# Patient Record
Sex: Male | Born: 1975 | Race: Black or African American | Hispanic: No | Marital: Married | Smoking: Never smoker
Health system: Southern US, Community
[De-identification: ages and names within clinical notes are randomized; demographics above are authoritative.]

## PROBLEM LIST (undated history)

## (undated) DIAGNOSIS — I10 Essential (primary) hypertension: Secondary | ICD-10-CM

## (undated) HISTORY — DX: Essential (primary) hypertension: I10

## (undated) HISTORY — PX: WRIST SURGERY: SHX841

---

## 2012-05-16 ENCOUNTER — Encounter: Payer: Self-pay | Admitting: Family Medicine

## 2012-05-16 ENCOUNTER — Ambulatory Visit: Payer: 59

## 2012-05-16 ENCOUNTER — Ambulatory Visit (INDEPENDENT_AMBULATORY_CARE_PROVIDER_SITE_OTHER): Payer: 59 | Admitting: Family Medicine

## 2012-05-16 VITALS — BP 135/73 | HR 56 | Temp 98.0°F | Resp 16 | Ht 71.5 in | Wt 177.8 lb

## 2012-05-16 DIAGNOSIS — E785 Hyperlipidemia, unspecified: Secondary | ICD-10-CM

## 2012-05-16 DIAGNOSIS — I10 Essential (primary) hypertension: Secondary | ICD-10-CM

## 2012-05-16 DIAGNOSIS — M25539 Pain in unspecified wrist: Secondary | ICD-10-CM

## 2012-05-16 LAB — COMPREHENSIVE METABOLIC PANEL
Albumin: 4.8 g/dL (ref 3.5–5.2)
Alkaline Phosphatase: 73 U/L (ref 39–117)
Calcium: 9.7 mg/dL (ref 8.4–10.5)
Chloride: 102 mEq/L (ref 96–112)
Glucose, Bld: 84 mg/dL (ref 70–99)
Potassium: 4.2 mEq/L (ref 3.5–5.3)
Sodium: 137 mEq/L (ref 135–145)
Total Protein: 7.5 g/dL (ref 6.0–8.3)

## 2012-05-16 LAB — LIPID PANEL
LDL Cholesterol: 108 mg/dL — ABNORMAL HIGH (ref 0–99)
Triglycerides: 62 mg/dL (ref <150)

## 2012-05-16 MED ORDER — LISINOPRIL 20 MG PO TABS: 20.0000 mg | ORAL_TABLET | Freq: Every day | ORAL | Status: DC

## 2012-05-16 NOTE — Patient Instructions (Addendum)
If wrist pain not improving in 3-4 weeks - recheck. Can use over the counter alleve as needed and wrist brace temporarily.  Work on home exercise program. Your should receive a call or letter about your lab results within the next week to 10 days.

## 2012-05-16 NOTE — Progress Notes (Signed)
  Subjective:    Patient ID: Jerry Johnston, male    DOB: 27-Jan-1976, 36 y.o.   MRN: [id number]  HPI Jerry Johnston is a 36 y.o. male  F/u HTN - Low 104/58 - felt a little lightheaded, but was taking supplement then.  No sx's since stopping the supplements.  Still taking omega 3's, and MVI.  Eating well - no red meat in few months. LDL 126 - 10/03/10  R wrist sore - few weeks, notice it with bar curls in radial deviation.  tx : alleve and otc brace.  Notices pushing lawnmower.  Has cut back on weightlifting - helps some.  Martial arts class few years ago, wrist locks,.- rare pain since then, but more in past few weeks.  Did fall on outstretched hand during past few months at one point.  No swelling.  Review of Systems  Constitutional: Negative for fatigue and unexpected weight change.  Eyes: Negative for visual disturbance.  Respiratory: Negative for cough, chest tightness and shortness of breath.   Cardiovascular: Negative for chest pain, palpitations and leg swelling.  Gastrointestinal: Negative for abdominal pain and blood in stool.  Neurological: Positive for light-headedness. Negative for dizziness and headaches.       See hpi - single episode.  No new se's with meds.       Objective:   Physical Exam  Constitutional: He is oriented to person, place, and time. He appears well-developed and well-nourished.  HENT:  Head: Normocephalic and atraumatic.  Eyes: EOM are normal. Pupils are equal, round, and reactive to light.  Neck: No JVD present. Carotid bruit is not present.  Cardiovascular: Normal rate, regular rhythm and normal heart sounds.   No murmur heard. Pulmonary/Chest: Effort normal and breath sounds normal. He has no rales.  Abdominal: Soft. Bowel sounds are normal. He exhibits no pulsatile midline mass.  Musculoskeletal: He exhibits no edema.       Right wrist: He exhibits normal range of motion, no tenderness, no bony tenderness, no swelling and no effusion.   Minimal dorsal proximal wrist discomfort with resisted radial dev'n   Negative tinels and finkelsteins  Neurological: He is alert and oriented to person, place, and time.  Skin: Skin is warm and dry.  Psychiatric: He has a normal mood and affect. His behavior is normal.   [facility] reading (PRIMARY) by  Dr.Lynsay Fesperman: R wrist: no apparent fx.  No apparent scapholunate widening.      Assessment & Plan:  Jerry Johnston is a 36 y.o. male HTN - stable.  Improved libido (see prior ov's).  Check CMP and lipid panel - prior borderline LDL. Cont same meds.  R Wrist pain - past few months more noticeable.? Overuse vs prior strain - otc bracing ok.  No apparent scaphoid or lunate fx/necrosis on XR, but consider MRI if not improving in few weeks.   alleve prn, hep and recheck in 3-4 weeks if not improving.

## 2012-05-22 ENCOUNTER — Other Ambulatory Visit: Payer: Self-pay | Admitting: Family Medicine

## 2012-05-22 DIAGNOSIS — I10 Essential (primary) hypertension: Secondary | ICD-10-CM

## 2012-05-22 DIAGNOSIS — R7989 Other specified abnormal findings of blood chemistry: Secondary | ICD-10-CM

## 2012-06-05 ENCOUNTER — Other Ambulatory Visit (INDEPENDENT_AMBULATORY_CARE_PROVIDER_SITE_OTHER): Payer: 59 | Admitting: Family Medicine

## 2012-06-05 DIAGNOSIS — I10 Essential (primary) hypertension: Secondary | ICD-10-CM

## 2012-06-05 DIAGNOSIS — R7989 Other specified abnormal findings of blood chemistry: Secondary | ICD-10-CM

## 2012-06-05 DIAGNOSIS — R799 Abnormal finding of blood chemistry, unspecified: Secondary | ICD-10-CM

## 2012-06-05 LAB — BASIC METABOLIC PANEL
CO2: 28 mEq/L (ref 19–32)
Chloride: 102 mEq/L (ref 96–112)
Creat: 1.37 mg/dL — ABNORMAL HIGH (ref 0.50–1.35)
Glucose, Bld: 80 mg/dL (ref 70–99)
Sodium: 137 mEq/L (ref 135–145)

## 2012-11-21 ENCOUNTER — Ambulatory Visit: Payer: 59 | Admitting: Family Medicine

## 2012-11-24 ENCOUNTER — Encounter: Payer: Self-pay | Admitting: Family Medicine

## 2012-11-24 ENCOUNTER — Ambulatory Visit (INDEPENDENT_AMBULATORY_CARE_PROVIDER_SITE_OTHER): Payer: 59 | Admitting: Family Medicine

## 2012-11-24 VITALS — BP 134/69 | HR 68 | Temp 98.7°F | Resp 17 | Ht 73.0 in | Wt 180.0 lb

## 2012-11-24 DIAGNOSIS — I10 Essential (primary) hypertension: Secondary | ICD-10-CM

## 2012-11-24 DIAGNOSIS — M79609 Pain in unspecified limb: Secondary | ICD-10-CM

## 2012-11-24 DIAGNOSIS — M25531 Pain in right wrist: Secondary | ICD-10-CM

## 2012-11-24 MED ORDER — LISINOPRIL 20 MG PO TABS: 20.0000 mg | ORAL_TABLET | Freq: Every day | ORAL | Status: DC

## 2012-11-24 NOTE — Patient Instructions (Signed)
Your should receive a call or letter about your lab results within the next week to 10 days.  You should receive a call about thew hand therapy referral.  Recheck with me in 6 months.

## 2012-11-24 NOTE — Progress Notes (Signed)
Subjective:    Patient ID: Jerry Johnston, male    DOB: Jan 11, 1976, 36 y.o.   MRN: [id number]  HPI Jerry Johnston is a 36 y.o. male  HTN- no new side effects with meds.  Work had cholesterol testing - 09/26/12: TC: 200, HDL 64, trig 72, LDL 122. Glucose 75, BP 120/76. BMI 23.3  Taking lisinopril 20mg  qd.  Rare missed dose. Outside bp's: 120-129/61-69. Creat 1.53 to 1.37 form 5/13 to lab only visit in June.  Drinking more water - staying more hydrated now.  About 2 and 1/2 to 3 liters  Per day.    R wrist pain - R wrist sore - few weeks at 05/16/12 office visit. notice it with bar curls in radial deviation.  tx : alleve and otc brace.  Notices pushing lawnmower.  Has cut back on weightlifting - helped some.  Martial arts class few years ago, wrist locks,.- rare pain since then, but more in few weeks with falls noted at last office visit. (Did fall on outstretched hand during past few months at one point.  No swelling.)  XRay report on 5/31//13: Findings: There is no evidence of fracture or dislocation. There  is no evidence of arthropathy or other focal bone abnormality.  Soft tissues are unremarkable.  IMPRESSION:  Negative exam  Not as bad as few months ago - not daily, on.y notices with certain activities - picking up heavy objects or repetitive use only. Pain is on ulnar side of wrist.  No regular nsaid use - rare alleve. R hand dominant   Review of Systems  Constitutional: Negative for fatigue and unexpected weight change.  Eyes: Negative for visual disturbance.  Respiratory: Negative for cough, chest tightness and shortness of breath.   Cardiovascular: Negative for chest pain, palpitations and leg swelling.  Gastrointestinal: Negative for abdominal pain and blood in stool.  Musculoskeletal: Positive for arthralgias (R wrist as bove. ).  Neurological: Negative for dizziness, light-headedness and headaches.       Objective:   Physical Exam  Constitutional: He is oriented to  person, place, and time. He appears well-developed and well-nourished.  HENT:  Head: Normocephalic and atraumatic.  Eyes: EOM are normal. Pupils are equal, round, and reactive to light.  Neck: No JVD present. Carotid bruit is not present.  Cardiovascular: Normal rate, regular rhythm and normal heart sounds.   No murmur heard. Pulmonary/Chest: Effort normal and breath sounds normal. He has no rales.  Musculoskeletal: He exhibits no edema.       Right wrist: He exhibits tenderness (over ulnar sided wrist - area of tfcc. no bony ttp .pain with loading ulnar sided wrist. negative tinel, phalen, and finlkelstein's. ). He exhibits normal range of motion, no bony tenderness, no swelling, no effusion and no crepitus.  Neurological: He is alert and oriented to person, place, and time.  Skin: Skin is warm and dry.  Psychiatric: He has a normal mood and affect.       Assessment & Plan:  [name] is a 36 y.o. male 1. HTN (hypertension)   2. Right wrist pain    HTN - stable, borderline creat in past - recheck BMP, maintain adequate hydration. 6 months meds refilled.   R wrist pain - improved, episodic.  Possible TFCC injury in hindisght.  Discussed MRI, ortho eval, but as only mild sx's - can try PT initially - if no improvement - would check MRI or hand/orhto eval.  rtc precautions.   Patient Instructions  Your should receive a call or letter about your lab results within the next week to 10 days.  You should receive a call about thew hand therapy referral.  Recheck with me in 6 months.

## 2012-11-25 LAB — BASIC METABOLIC PANEL
BUN: 15 mg/dL (ref 6–23)
CO2: 30 mEq/L (ref 19–32)
Chloride: 103 mEq/L (ref 96–112)
Creat: 1.24 mg/dL (ref 0.50–1.35)

## 2013-01-14 ENCOUNTER — Telehealth: Payer: Self-pay

## 2013-01-14 DIAGNOSIS — M25531 Pain in right wrist: Secondary | ICD-10-CM

## 2013-01-14 NOTE — Telephone Encounter (Signed)
tfc tear and wrist completed 7 treatments w/o improvement Would like an mri - if okay with dr. Neva Seat Pt   DORA at  [facility]  [phone]

## 2013-01-14 NOTE — Telephone Encounter (Signed)
Noted.  Discussed with Verlee Monte - agree with arranging MR arthrogram of wrist to eval for TFCC tear, then if torn, can refer to Dr. Janee Morn at [facility] ortho.

## 2013-01-14 NOTE — Telephone Encounter (Signed)
Patient has been to PT for wrist/ no improvement would like to proceed with MRI scan, please advise.

## 2013-01-15 ENCOUNTER — Other Ambulatory Visit: Payer: Self-pay | Admitting: Radiology

## 2013-01-15 DIAGNOSIS — M25531 Pain in right wrist: Secondary | ICD-10-CM

## 2013-01-20 ENCOUNTER — Telehealth: Payer: Self-pay | Admitting: Radiology

## 2013-01-20 DIAGNOSIS — M25531 Pain in right wrist: Secondary | ICD-10-CM

## 2013-01-20 NOTE — Telephone Encounter (Signed)
They also needed order for needle placement under fluoro to go with the MR/ Arthrogram.

## 2013-01-20 NOTE — Telephone Encounter (Signed)
Phone call from [facility] they have advised to add on Arthrogram to r/o TFCC tear. Jerry Johnston

## 2013-01-21 ENCOUNTER — Other Ambulatory Visit: Payer: Self-pay

## 2013-01-27 ENCOUNTER — Ambulatory Visit
Admission: RE | Admit: 2013-01-27 | Discharge: 2013-01-27 | Disposition: A | Discharge status: Home / Self Care | Payer: PRIVATE HEALTH INSURANCE | Source: Ambulatory Visit | Attending: Family Medicine | Admitting: Family Medicine

## 2013-01-27 ENCOUNTER — Other Ambulatory Visit: Payer: Self-pay | Admitting: Family Medicine

## 2013-01-27 DIAGNOSIS — IMO0001 Reserved for inherently not codable concepts without codable children: Secondary | ICD-10-CM

## 2013-01-27 DIAGNOSIS — M25531 Pain in right wrist: Secondary | ICD-10-CM

## 2013-01-27 MED ORDER — IOHEXOL 180 MG/ML  SOLN
3.0000 mL | Freq: Once | INTRAMUSCULAR | Status: AC | PRN
  Administered 2013-01-27: 3 mL via INTRAVENOUS

## 2013-02-05 ENCOUNTER — Other Ambulatory Visit: Payer: Self-pay | Admitting: Family Medicine

## 2013-02-05 DIAGNOSIS — S638X1A Sprain of other part of right wrist and hand, initial encounter: Secondary | ICD-10-CM

## 2013-03-25 SURGERY — Surgical Case
Anesthesia: *Unknown

## 2013-03-31 ENCOUNTER — Ambulatory Visit (INDEPENDENT_AMBULATORY_CARE_PROVIDER_SITE_OTHER): Payer: Managed Care, Other (non HMO) | Admitting: Family Medicine

## 2013-03-31 VITALS — BP 127/79 | HR 61 | Temp 97.9°F | Resp 16 | Ht 72.0 in | Wt 180.0 lb

## 2013-03-31 DIAGNOSIS — Z888 Allergy status to other drugs, medicaments and biological substances status: Secondary | ICD-10-CM

## 2013-03-31 DIAGNOSIS — Z889 Allergy status to unspecified drugs, medicaments and biological substances status: Secondary | ICD-10-CM

## 2013-03-31 NOTE — Progress Notes (Signed)
Jerry Johnston is a 37 y.o. male who presents to [facility] today for rash. Patient is taking Percocet for pain control following a wrist surgery. He notes this morning he had a mildly itchy rash on his for head. He stopped taking the Percocet this morning. He denies any trouble breathing or swallowing mouth tingling. He feels well otherwise with no fevers or chill.  He has never had a drug reaction before.  His pain is well-controlled with 2 Aleve twice a day. He is scheduled to see Dr. Janee Morn on Tuesday.    PMH: Reviewed healthy otherwise History  Substance Use Topics  . Smoking status: Never Smoker   . Smokeless tobacco: Not on file  . Alcohol Use: Yes   ROS as above  Medications reviewed. Current Outpatient Prescriptions  Medication Sig Dispense Refill  . lisinopril (PRINIVIL,ZESTRIL) 20 MG tablet Take 1 tablet (20 mg total) by mouth daily.  90 tablet  1  . oxycodone-acetaminophen (PERCOCET) 2.5-325 MG per tablet Take 1 tablet by mouth every 4 (four) hours as needed for pain.      . fish oil-omega-3 fatty acids 1000 MG capsule Take 2 g by mouth daily.      . Multiple Vitamin (MULTIVITAMIN) tablet Take 1 tablet by mouth daily.       No current facility-administered medications for this visit.    Exam:  BP 127/79  Pulse 61  Temp(Src) 97.9 F (36.6 C) (Oral)  Resp 16  Ht 6' (1.829 m)  Wt 180 lb (81.647 kg)  BMI 24.41 kg/m2  SpO2 100% Gen: Well NAD HEENT: EOMI,  MMM Lungs: CTABL Nl WOB Heart: RRR no MRG Abd: NABS, NT, ND Exts: Non edematous BL  LE, warm and well perfused.  Skin: Mild erythematous rash on forhead  No results found for this or any previous visit (from the past 72 hour(s)).  Assessment and Plan: 37 y.o. male with possible drug allergy/reaction. Possibly unrelated dermatitis. His pain is well controlled off of Percocet recommend patient stop taking Percocet and follow up with Dr. Janee Morn as scheduled. Discussed warning signs or symptoms. Patient expresses  understanding and agreement.

## 2013-03-31 NOTE — Patient Instructions (Addendum)
Thank you for coming in today. I am not sure if you are allergic to percocet.  You can take benadryl if you are itching.  Stop percocet.  Take 2 aleve twice a day as needed for pain.  If you have trouble breathing or swallowing go to the ER.

## 2013-06-01 ENCOUNTER — Encounter: Payer: Self-pay | Admitting: Family Medicine

## 2013-06-01 ENCOUNTER — Ambulatory Visit (INDEPENDENT_AMBULATORY_CARE_PROVIDER_SITE_OTHER): Payer: Managed Care, Other (non HMO) | Admitting: Family Medicine

## 2013-06-01 VITALS — BP 120/74 | HR 63 | Temp 98.3°F | Resp 16 | Ht 71.5 in | Wt 180.2 lb

## 2013-06-01 DIAGNOSIS — I10 Essential (primary) hypertension: Secondary | ICD-10-CM

## 2013-06-01 LAB — BASIC METABOLIC PANEL
CO2: 24 mEq/L (ref 19–32)
Calcium: 9.6 mg/dL (ref 8.4–10.5)
Chloride: 104 mEq/L (ref 96–112)
Glucose, Bld: 93 mg/dL (ref 70–99)
Potassium: 4.2 mEq/L (ref 3.5–5.3)
Sodium: 139 mEq/L (ref 135–145)

## 2013-06-01 MED ORDER — LISINOPRIL 20 MG PO TABS: 20.0000 mg | ORAL_TABLET | Freq: Every day | ORAL | Status: DC

## 2013-06-01 NOTE — Progress Notes (Signed)
  Subjective:    Patient ID: Jerry Johnston, male    DOB: [birth year], 37 y.o.   MRN: [id number]  HPI Jerry Johnston is a 37 y.o. male  HTN - outside BP's - 114-132/58-75. Mostly in 110's/60-70 range.  Has been doing exercise 4-5 days per week. More cardio now than weightlifting. Not fasting. No cough, lightheadedness or dizziness. No new side effects.   Results for orders placed in visit on 11/24/12  BASIC METABOLIC PANEL      Result Value Range   Sodium 139  135 - 145 mEq/L   Potassium 4.7  3.5 - 5.3 mEq/L   Chloride 103  96 - 112 mEq/L   CO2 30  19 - 32 mEq/L   Glucose, Bld 79  70 - 99 mg/dL   BUN 15  6 - 23 mg/dL   Creat 4.09  8.11 - 9.14 mg/dL   Calcium 9.5  8.4 - 78.2 mg/dL    S/p surgery on R wrist 03/24/13 for ligamentous injury that did not improve with PT, apparently not TFCC.  2nd post op follow up with Dr. Janee Morn later today.    Review of Systems  Constitutional: Negative for fatigue and unexpected weight change.  Eyes: Negative for visual disturbance.  Respiratory: Negative for cough, chest tightness and shortness of breath.   Cardiovascular: Negative for chest pain, palpitations and leg swelling.  Gastrointestinal: Negative for abdominal pain and blood in stool.  Neurological: Negative for dizziness, light-headedness and headaches.       Objective:   Physical Exam  Vitals reviewed. Constitutional: He is oriented to person, place, and time. He appears well-developed and well-nourished.  HENT:  Head: Normocephalic and atraumatic.  Eyes: EOM are normal. Pupils are equal, round, and reactive to light.  Neck: No JVD present. Carotid bruit is not present.  Cardiovascular: Normal rate, regular rhythm and normal heart sounds.   No murmur heard. Pulmonary/Chest: Effort normal and breath sounds normal. He has no rales.  Musculoskeletal: He exhibits no edema.  Neurological: He is alert and oriented to person, place, and time.  Skin: Skin is warm and dry.   Psychiatric: He has a normal mood and affect.         Assessment & Plan:  Jerry Johnston is a 37 y.o. male HTN (hypertension) - Plan: lisinopril (PRINIVIL,ZESTRIL) 20 MG tablet, Basic metabolic panel  Controlled - can try decreased dose of lisinopril as below, but increase back to 20mg  if numbers elevate. Continue exercise. recheck in 6 months for CPE/fasting labs.   Meds ordered this encounter  Medications  . lisinopril (PRINIVIL,ZESTRIL) 20 MG tablet    Sig: Take 1 tablet (20 mg total) by mouth daily.    Dispense:  90 tablet    Refill:  1     Patient Instructions  Ok to try to split lisinopril in half (10mg ) each day if blood pressures remain in 110's over 60-70's.  If blood pressures run over 140/90 consistently - return to full pill each day.  Recheck in 6 months- should be for physical then. You should receive a call or letter about your lab results within the next week to 10 days.

## 2013-06-01 NOTE — Patient Instructions (Signed)
Ok to try to split lisinopril in half (10mg ) each day if blood pressures remain in 110's over 60-70's.  If blood pressures run over 140/90 consistently - return to full pill each day.  Recheck in 6 months- should be for physical then. You should receive a call or letter about your lab results within the next week to 10 days.

## 2013-10-22 ENCOUNTER — Other Ambulatory Visit: Payer: Self-pay

## 2013-11-30 ENCOUNTER — Encounter: Payer: Self-pay | Admitting: Family Medicine

## 2013-11-30 ENCOUNTER — Ambulatory Visit (INDEPENDENT_AMBULATORY_CARE_PROVIDER_SITE_OTHER): Payer: Managed Care, Other (non HMO) | Admitting: Family Medicine

## 2013-11-30 VITALS — BP 130/80 | HR 71 | Temp 98.0°F | Resp 16 | Ht 72.0 in | Wt 181.8 lb

## 2013-11-30 DIAGNOSIS — Z7251 High risk heterosexual behavior: Secondary | ICD-10-CM

## 2013-11-30 DIAGNOSIS — G4700 Insomnia, unspecified: Secondary | ICD-10-CM

## 2013-11-30 DIAGNOSIS — Z0000 Encounter for general adult medical examination without abnormal findings: Secondary | ICD-10-CM

## 2013-11-30 DIAGNOSIS — I10 Essential (primary) hypertension: Secondary | ICD-10-CM

## 2013-11-30 LAB — COMPREHENSIVE METABOLIC PANEL
Alkaline Phosphatase: 63 U/L (ref 39–117)
BUN: 11 mg/dL (ref 6–23)
CO2: 24 mEq/L (ref 19–32)
Creat: 1.07 mg/dL (ref 0.50–1.35)
Glucose, Bld: 95 mg/dL (ref 70–99)
Sodium: 135 mEq/L (ref 135–145)
Total Bilirubin: 0.7 mg/dL (ref 0.3–1.2)
Total Protein: 8.1 g/dL (ref 6.0–8.3)

## 2013-11-30 LAB — LIPID PANEL
HDL: 56 mg/dL (ref >39)
LDL Cholesterol: 128 mg/dL — ABNORMAL HIGH (ref 0–99)
Total CHOL/HDL Ratio: 3.5 Ratio
VLDL: 13 mg/dL (ref 0–40)

## 2013-11-30 LAB — CBC
HCT: 46.9 % (ref 39.0–52.0)
Hemoglobin: 16.5 g/dL (ref 13.0–17.0)
MCH: 29.9 pg (ref 26.0–34.0)
MCHC: 35.2 g/dL (ref 30.0–36.0)
MCV: 85 fL (ref 78.0–100.0)
RDW: 13.1 % (ref 11.5–15.5)

## 2013-11-30 MED ORDER — LISINOPRIL 20 MG PO TABS: 20.0000 mg | ORAL_TABLET | Freq: Every day | ORAL | Status: DC

## 2013-11-30 MED ORDER — ALPRAZOLAM 0.5 MG PO TABS: 0.5000 mg | ORAL_TABLET | Freq: Every evening | ORAL | Status: DC | PRN

## 2013-11-30 NOTE — Progress Notes (Signed)
   Subjective:    Patient ID: Jerry Johnston, male    DOB: [birth year], 37 y.o.   MRN: [id number]  HPI    Review of Systems  Constitutional: Negative.   HENT: Negative.   Eyes: Negative.   Respiratory: Negative.   Cardiovascular: Negative.   Gastrointestinal: Negative.   Endocrine: Negative.   Genitourinary: Negative.   Musculoskeletal: Negative.   Skin: Negative.   Allergic/Immunologic: Negative.   Neurological: Negative.   Hematological: Negative.   Psychiatric/Behavioral: Positive for sleep disturbance.       Objective:   Physical Exam        Assessment & Plan:

## 2013-11-30 NOTE — Patient Instructions (Addendum)
Can continue same dose of lisinopril, or if you want to try a few weeks on 1/2 dose, keep a record of your blood pressures. You should receive a call or letter about your lab results within the next week to 10 days. May need true 8 hour fasting levels, but can look at results first.  Work on sleep hygiene as discussed.  Xanax at bedtime if needed only. Let me know if this persists.  Plan on recheck in 6 months.  Return to the clinic or go to the nearest emergency room if any of your symptoms worsen or new symptoms occur.   Insomnia Insomnia is frequent trouble falling and/or staying asleep. Insomnia can be a long term problem or a short term problem. Both are common. Insomnia can be a short term problem when the wakefulness is related to a certain stress or worry. Long term insomnia is often related to ongoing stress during waking hours and/or poor sleeping habits. Overtime, sleep deprivation itself can make the problem worse. Every little thing feels more severe because you are overtired and your ability to cope is decreased. CAUSES   Stress, anxiety, and depression.  Poor sleeping habits.  Distractions such as TV in the bedroom.  Naps close to bedtime.  Engaging in emotionally charged conversations before bed.  Technical reading before sleep.  Alcohol and other sedatives. They may make the problem worse. They can hurt normal sleep patterns and normal dream activity.  Stimulants such as caffeine for several hours prior to bedtime.  Pain syndromes and shortness of breath can cause insomnia.  Exercise late at night.  Changing time zones may cause sleeping problems (jet lag). It is sometimes helpful to have someone observe your sleeping patterns. They should look for periods of not breathing during the night (sleep apnea). They should also look to see how long those periods last. If you live alone or observers are uncertain, you can also be observed at a sleep clinic where your sleep  patterns will be professionally monitored. Sleep apnea requires a checkup and treatment. Give your caregivers your medical history. Give your caregivers observations your family has made about your sleep.  SYMPTOMS   Not feeling rested in the morning.  Anxiety and restlessness at bedtime.  Difficulty falling and staying asleep. TREATMENT   Your caregiver may prescribe treatment for an underlying medical disorders. Your caregiver can give advice or help if you are using alcohol or other drugs for self-medication. Treatment of underlying problems will usually eliminate insomnia problems.  Medications can be prescribed for short time use. They are generally not recommended for lengthy use.  Over-the-counter sleep medicines are not recommended for lengthy use. They can be habit forming.  You can promote easier sleeping by making lifestyle changes such as:  Using relaxation techniques that help with breathing and reduce muscle tension.  Exercising earlier in the day.  Changing your diet and the time of your last meal. No night time snacks.  Establish a regular time to go to bed.  Counseling can help with stressful problems and worry.  Soothing music and white noise may be helpful if there are background noises you cannot remove.  Stop tedious detailed work at least one hour before bedtime. HOME CARE INSTRUCTIONS   Keep a diary. Inform your caregiver about your progress. This includes any medication side effects. See your caregiver regularly. Take note of:  Times when you are asleep.  Times when you are awake during the night.  The quality of  your sleep.  How you feel the next day. This information will help your caregiver care for you.  Get out of bed if you are still awake after 15 minutes. Read or do some quiet activity. Keep the lights down. Wait until you feel sleepy and go back to bed.  Keep regular sleeping and waking hours. Avoid naps.  Exercise regularly.  Avoid  distractions at bedtime. Distractions include watching television or engaging in any intense or detailed activity like attempting to balance the household checkbook.  Develop a bedtime ritual. Keep a familiar routine of bathing, brushing your teeth, climbing into bed at the same time each night, listening to soothing music. Routines increase the success of falling to sleep faster.  Use relaxation techniques. This can be using breathing and muscle tension release routines. It can also include visualizing peaceful scenes. You can also help control troubling or intruding thoughts by keeping your mind occupied with boring or repetitive thoughts like the old concept of counting sheep. You can make it more creative like imagining planting one beautiful flower after another in your backyard garden.  During your day, work to eliminate stress. When this is not possible use some of the previous suggestions to help reduce the anxiety that accompanies stressful situations. MAKE SURE YOU:   Understand these instructions.  Will watch your condition.  Will get help right away if you are not doing well or get worse. Document Released: 11/30/2000 Document Revised: 02/25/2012 Document Reviewed: 12/31/2007 [facility] Patient Information 2014 [place], [place].    Keeping you healthy  Get these tests  Blood pressure- Have your blood pressure checked once a year by your healthcare provider.  Normal blood pressure is 120/80.  Weight- Have your body mass index (BMI) calculated to screen for obesity.  BMI is a measure of body fat based on height and weight. You can also calculate your own BMI at https://www.west-esparza.com/.  Cholesterol- Have your cholesterol checked regularly starting at age 87, sooner may be necessary if you have diabetes, high blood pressure, if a family member developed heart diseases at an early age or if you smoke.   Chlamydia, HIV, and other sexual transmitted disease- Get screened each year  until the age of 96 then within three months of each new sexual partner.  Diabetes- Have your blood sugar checked regularly if you have high blood pressure, high cholesterol, a family history of diabetes or if you are overweight.  Get these vaccines  Flu shot- Every fall.  Tetanus shot- Every 10 years.  Menactra- Single dose; prevents meningitis.  Take these steps  Don't smoke- If you do smoke, ask your healthcare provider about quitting. For tips on how to quit, go to www.smokefree.gov or call 1-800-QUIT-NOW.  Be physically active- Exercise 5 days a week for at least 30 minutes.  If you are not already physically active start slow and gradually work up to 30 minutes of moderate physical activity.  Examples of moderate activity include walking briskly, mowing the yard, dancing, swimming bicycling, etc.  Eat a healthy diet- Eat a variety of healthy foods such as fruits, vegetables, low fat milk, low fat cheese, yogurt, lean meats, poultry, fish, beans, tofu, etc.  For more information on healthy eating, go to www.thenutritionsource.org  Drink alcohol in moderation- Limit alcohol intake two drinks or less a day.  Never drink and drive.  Dentist- Brush and floss teeth twice daily; visit your dentis twice a year.  Depression-Your emotional health is as important as your physical health.  If you're feeling down, losing interest in things you normally enjoy please talk with your healthcare provider.  Gun Safety- If you keep a gun in your home, keep it unloaded and with the safety lock on.  Bullets should be stored separately.  Helmet use- Always wear a helmet when riding a motorcycle, bicycle, rollerblading or skateboarding.  Safe sex- If you may be exposed to a sexually transmitted infection, use a condom  Seat belts- Seat bels can save your life; always wear one.  Smoke/Carbon Monoxide detectors- These detectors need to be installed on the appropriate level of your home.  Replace  batteries at least once a year.  Skin Cancer- When out in the sun, cover up and use sunscreen SPF 15 or higher.  Violence- If anyone is threatening or hurting you, please tell your healthcare provider.

## 2013-11-30 NOTE — Progress Notes (Signed)
Subjective   :    Patient ID: Jerry Johnston, male    DOB: September 18, 1976, 37 y.o.   MRN: [id number]  HPI Jerry Johnston is a 37 y.o. male  Tetanus/Tdap: 5/08 Flu vaccine : 09/30/13 Dentist: plans on scheduling Optho/eye care eval: no recent eval, no eye doctor. Sexual activity: 2 partners in past year. No hx of STI. No new rash, no penile d/c, asx. No unprotected intercourse.   S/p Hep B vaccination x3, Hep A x2, in 2008. Tdap in 5/08.    Recent breakup with GF. Started dating after Labor Day. Found out her intentions last night - not for serious relationship. Trouble sleeping some past 3 weeks with seeing things not going well. Sexually active prior, not in past 3 weeks. Mind racing, then wakes up thinking again. No depression, no SI.  Etoh: few drinks per week, some none.   HTN - discussed decreased dose of lisinopril at last ov in June to 1/2 of 20mg  (10mg  dose) QD, but stayed at same dose Weight 180 last ov. 181 today.Creat 1.24 on 06/01/13. Last lipids 05/16/12 - LDL 108, but HDL 50, tchol 170.  - see below.  Home blood pressures: 116/58 - 132/72. Health screening 09/30/13 - tchol: 215, LDL 142, trig 63, HDL 60. Has been trying to watch diet, exercising 4- 5 times per week - improved since oct. Last ate 5 hours ago.     Chemistry      Component Value Date/Time   NA 139 06/01/2013 1503   K 4.2 06/01/2013 1503   CL 104 06/01/2013 1503   CO2 24 06/01/2013 1503   BUN 14 06/01/2013 1503   CREATININE 1.24 06/01/2013 1503      Component Value Date/Time   CALCIUM 9.6 06/01/2013 1503   ALKPHOS 73 05/16/2012 1356   AST 22 05/16/2012 1356   ALT 16 05/16/2012 1356   BILITOT 0.9 05/16/2012 1356     Lab Results  Component Value Date   CHOL 170 05/16/2012   HDL 50 05/16/2012   LDLCALC 108* 05/16/2012   TRIG 62 05/16/2012   CHOLHDL 3.4 05/16/2012     Patient Active Problem List   Diagnosis Date Noted  . Drug allergy 03/31/2013   Past Medical History  Diagnosis Date  . Hypertension     Past Surgical History  Procedure Laterality Date  . Wrist surgery     Allergies  Allergen Reactions  . Percocet [Oxycodone-Acetaminophen]     ? Reaction pt stated he had broken out during the time of taking med.   Prior to Admission medications   Medication Sig Start Date End Date Taking? Authorizing Provider  fish oil-omega-3 fatty acids 1000 MG capsule Take 2 g by mouth daily.    Historical Provider, MD  lisinopril (PRINIVIL,ZESTRIL) 20 MG tablet Take 1 tablet (20 mg total) by mouth daily. 06/01/13   [name], MD  Multiple Vitamin (MULTIVITAMIN) tablet Take 1 tablet by mouth daily.    Historical Provider, MD  oxycodone-acetaminophen (PERCOCET) 2.5-325 MG per tablet Take 1 tablet by mouth every 4 (four) hours as needed for pain.    Historical Provider, MD   History   Social History  . Marital Status: Single    Spouse Name: N/A    Number of Children: N/A  . Years of Education: N/A   Occupational History  . Not on file.   Social History Main Topics  . Smoking status: Never Smoker   . Smokeless tobacco: Not on  file  . Alcohol Use: Yes  . Drug Use: No  . Sexual Activity: Yes    Birth Control/ Protection: Condom   Other Topics Concern  . Not on file   Social History Narrative  . No narrative on file    Review of Systems 13 point review of systems per patient health survey noted.  Negative other than as indicated on reviewed nursing note.      Objective:   Physical Exam  Vitals reviewed. Constitutional: He is oriented to person, place, and time. He appears well-developed and well-nourished.  HENT:  Head: Normocephalic and atraumatic.  Right Ear: External ear normal.  Left Ear: External ear normal.  Mouth/Throat: Oropharynx is clear and moist.  Eyes: Conjunctivae and EOM are normal. Pupils are equal, round, and reactive to light.  Neck: Normal range of motion. Neck supple. No JVD present. Carotid bruit is not present. No thyromegaly present.   Cardiovascular: Normal rate, regular rhythm, normal heart sounds and intact distal pulses.   No murmur heard. Pulmonary/Chest: Effort normal and breath sounds normal. No respiratory distress. He has no wheezes. He has no rales.  Abdominal: Soft. He exhibits no distension. There is no tenderness. Hernia confirmed negative in the right inguinal area and confirmed negative in the left inguinal area.  Genitourinary:  Genital exam deferred.   Musculoskeletal: Normal range of motion. He exhibits no edema and no tenderness.  Lymphadenopathy:    He has no cervical adenopathy.  Neurological: He is alert and oriented to person, place, and time. He has normal reflexes.  Skin: Skin is warm and dry.  Psychiatric: He has a normal mood and affect. His behavior is normal.      Assessment & Plan:   [name] is a 37 y.o. male Routine general medical examination at a health care facility - Plan: CBC, TSH, Comprehensive metabolic panel, Lipid panel, Hepatitis B surface antigen, Hepatitis B surface antibody, Hepatitis C antibody, HIV antibody, RPR, HSV(herpes simplex vrs) 1+2 ab-IgG, GC/Chlamydia Probe Amp.  Anticipatory guidance below, labs above.   HTN (hypertension) - Plan: CBC, TSH, Comprehensive metabolic panel, lisinopril (PRINIVIL,ZESTRIL) 20 MG tablet.  Stable on outside readings. Cont same dose lisinopril for now, but if trending lower BP's can self decrease for trial of 1/2 lisinopril 20mg  qd. Recheck in 6 months.   Insomnia - Plan: TSH, ALPRAZolam (XANAX) 0.5 MG tablet.  Sleep hygiene discussed.situational stress, counseled on this.  If needed short term, can try xanax qhs prn. Sed.   Problems related to high-risk sexual behavior - Plan: HIV antibody, RPR, HSV(herpes simplex vrs) 1+2 ab-IgG, GC/Chlamydia Probe Amp.  Asymptomatic, but 2 new partners in past year. Safer sex practices discussed. rtc precautions.    Meds ordered this encounter  Medications  . lisinopril (PRINIVIL,ZESTRIL) 20  MG tablet    Sig: Take 1 tablet (20 mg total) by mouth daily.    Dispense:  90 tablet    Refill:  1  . ALPRAZolam (XANAX) 0.5 MG tablet    Sig: Take 1 tablet (0.5 mg total) by mouth at bedtime as needed for sleep.    Dispense:  20 tablet    Refill:  0   Patient Instructions  Can continue same dose of lisinopril, or if you want to try a few weeks on 1/2 dose, keep a record of your blood pressures. You should receive a call or letter about your lab results within the next week to 10 days. May need true 8 hour fasting levels, but  can look at results first.  Work on sleep hygiene as discussed.  Xanax at bedtime if needed only. Let me know if this persists.  Plan on recheck in 6 months.  Return to the clinic or go to the nearest emergency room if any of your symptoms worsen or new symptoms occur.   Insomnia Insomnia is frequent trouble falling and/or staying asleep. Insomnia can be a long term problem or a short term problem. Both are common. Insomnia can be a short term problem when the wakefulness is related to a certain stress or worry. Long term insomnia is often related to ongoing stress during waking hours and/or poor sleeping habits. Overtime, sleep deprivation itself can make the problem worse. Every little thing feels more severe because you are overtired and your ability to cope is decreased. CAUSES   Stress, anxiety, and depression.  Poor sleeping habits.  Distractions such as TV in the bedroom.  Naps close to bedtime.  Engaging in emotionally charged conversations before bed.  Technical reading before sleep.  Alcohol and other sedatives. They may make the problem worse. They can hurt normal sleep patterns and normal dream activity.  Stimulants such as caffeine for several hours prior to bedtime.  Pain syndromes and shortness of breath can cause insomnia.  Exercise late at night.  Changing time zones may cause sleeping problems (jet lag). It is sometimes helpful to  have someone observe your sleeping patterns. They should look for periods of not breathing during the night (sleep apnea). They should also look to see how long those periods last. If you live alone or observers are uncertain, you can also be observed at a sleep clinic where your sleep patterns will be professionally monitored. Sleep apnea requires a checkup and treatment. Give your caregivers your medical history. Give your caregivers observations your family has made about your sleep.  SYMPTOMS   Not feeling rested in the morning.  Anxiety and restlessness at bedtime.  Difficulty falling and staying asleep. TREATMENT   Your caregiver may prescribe treatment for an underlying medical disorders. Your caregiver can give advice or help if you are using alcohol or other drugs for self-medication. Treatment of underlying problems will usually eliminate insomnia problems.  Medications can be prescribed for short time use. They are generally not recommended for lengthy use.  Over-the-counter sleep medicines are not recommended for lengthy use. They can be habit forming.  You can promote easier sleeping by making lifestyle changes such as:  Using relaxation techniques that help with breathing and reduce muscle tension.  Exercising earlier in the day.  Changing your diet and the time of your last meal. No night time snacks.  Establish a regular time to go to bed.  Counseling can help with stressful problems and worry.  Soothing music and white noise may be helpful if there are background noises you cannot remove.  Stop tedious detailed work at least one hour before bedtime. HOME CARE INSTRUCTIONS   Keep a diary. Inform your caregiver about your progress. This includes any medication side effects. See your caregiver regularly. Take note of:  Times when you are asleep.  Times when you are awake during the night.  The quality of your sleep.  How you feel the next day. This information  will help your caregiver care for you.  Get out of bed if you are still awake after 15 minutes. Read or do some quiet activity. Keep the lights down. Wait until you feel sleepy and go back to  bed.  Keep regular sleeping and waking hours. Avoid naps.  Exercise regularly.  Avoid distractions at bedtime. Distractions include watching television or engaging in any intense or detailed activity like attempting to balance the household checkbook.  Develop a bedtime ritual. Keep a familiar routine of bathing, brushing your teeth, climbing into bed at the same time each night, listening to soothing music. Routines increase the success of falling to sleep faster.  Use relaxation techniques. This can be using breathing and muscle tension release routines. It can also include visualizing peaceful scenes. You can also help control troubling or intruding thoughts by keeping your mind occupied with boring or repetitive thoughts like the old concept of counting sheep. You can make it more creative like imagining planting one beautiful flower after another in your backyard garden.  During your day, work to eliminate stress. When this is not possible use some of the previous suggestions to help reduce the anxiety that accompanies stressful situations. MAKE SURE YOU:   Understand these instructions.  Will watch your condition.  Will get help right away if you are not doing well or get worse. Document Released: 11/30/2000 Document Revised: 02/25/2012 Document Reviewed: 12/31/2007 [facility] Patient Information 2014 [place], [place].    Keeping you healthy  Get these tests  Blood pressure- Have your blood pressure checked once a year by your healthcare provider.  Normal blood pressure is 120/80.  Weight- Have your body mass index (BMI) calculated to screen for obesity.  BMI is a measure of body fat based on height and weight. You can also calculate your own BMI at https://www.west-esparza.com/.  Cholesterol-  Have your cholesterol checked regularly starting at age 63, sooner may be necessary if you have diabetes, high blood pressure, if a family member developed heart diseases at an early age or if you smoke.   Chlamydia, HIV, and other sexual transmitted disease- Get screened each year until the age of 79 then within three months of each new sexual partner.  Diabetes- Have your blood sugar checked regularly if you have high blood pressure, high cholesterol, a family history of diabetes or if you are overweight.  Get these vaccines  Flu shot- Every fall.  Tetanus shot- Every 10 years.  Menactra- Single dose; prevents meningitis.  Take these steps  Don't smoke- If you do smoke, ask your healthcare provider about quitting. For tips on how to quit, go to www.smokefree.gov or call 1-800-QUIT-NOW.  Be physically active- Exercise 5 days a week for at least 30 minutes.  If you are not already physically active start slow and gradually work up to 30 minutes of moderate physical activity.  Examples of moderate activity include walking briskly, mowing the yard, dancing, swimming bicycling, etc.  Eat a healthy diet- Eat a variety of healthy foods such as fruits, vegetables, low fat milk, low fat cheese, yogurt, lean meats, poultry, fish, beans, tofu, etc.  For more information on healthy eating, go to www.thenutritionsource.org  Drink alcohol in moderation- Limit alcohol intake two drinks or less a day.  Never drink and drive.  Dentist- Brush and floss teeth twice daily; visit your dentis twice a year.  Depression-Your emotional health is as important as your physical health.  If you're feeling down, losing interest in things you normally enjoy please talk with your healthcare provider.  Gun Safety- If you keep a gun in your home, keep it unloaded and with the safety lock on.  Bullets should be stored separately.  Helmet use- Always wear a helmet  when riding a motorcycle, bicycle, rollerblading or  skateboarding.  Safe sex- If you may be exposed to a sexually transmitted infection, use a condom  Seat belts- Seat bels can save your life; always wear one.  Smoke/Carbon Monoxide detectors- These detectors need to be installed on the appropriate level of your home.  Replace batteries at least once a year.  Skin Cancer- When out in the sun, cover up and use sunscreen SPF 15 or higher.  Violence- If anyone is threatening or hurting you, please tell your healthcare provider.

## 2013-12-01 LAB — RPR

## 2013-12-01 LAB — HEPATITIS B SURFACE ANTIBODY, QUANTITATIVE: Hepatitis B-Post: 4.6 m[IU]/mL

## 2013-12-01 LAB — GC/CHLAMYDIA PROBE AMP
CT Probe RNA: NEGATIVE
GC Probe RNA: NEGATIVE

## 2013-12-01 LAB — HEPATITIS B SURFACE ANTIGEN: Hepatitis B Surface Ag: NEGATIVE

## 2013-12-01 LAB — HEPATITIS C ANTIBODY: HCV Ab: NEGATIVE

## 2014-02-26 IMAGING — CR DG ORBITS FOR FOREIGN BODY
2 series · 2 of 2 positions shown · non-contrast
Comparison: None.

CLINICAL DATA: Metal working/exposure; clearance prior to MRI

ORBITS FOR FOREIGN BODY - 2 VIEW

[view not recorded (1 of 2)]
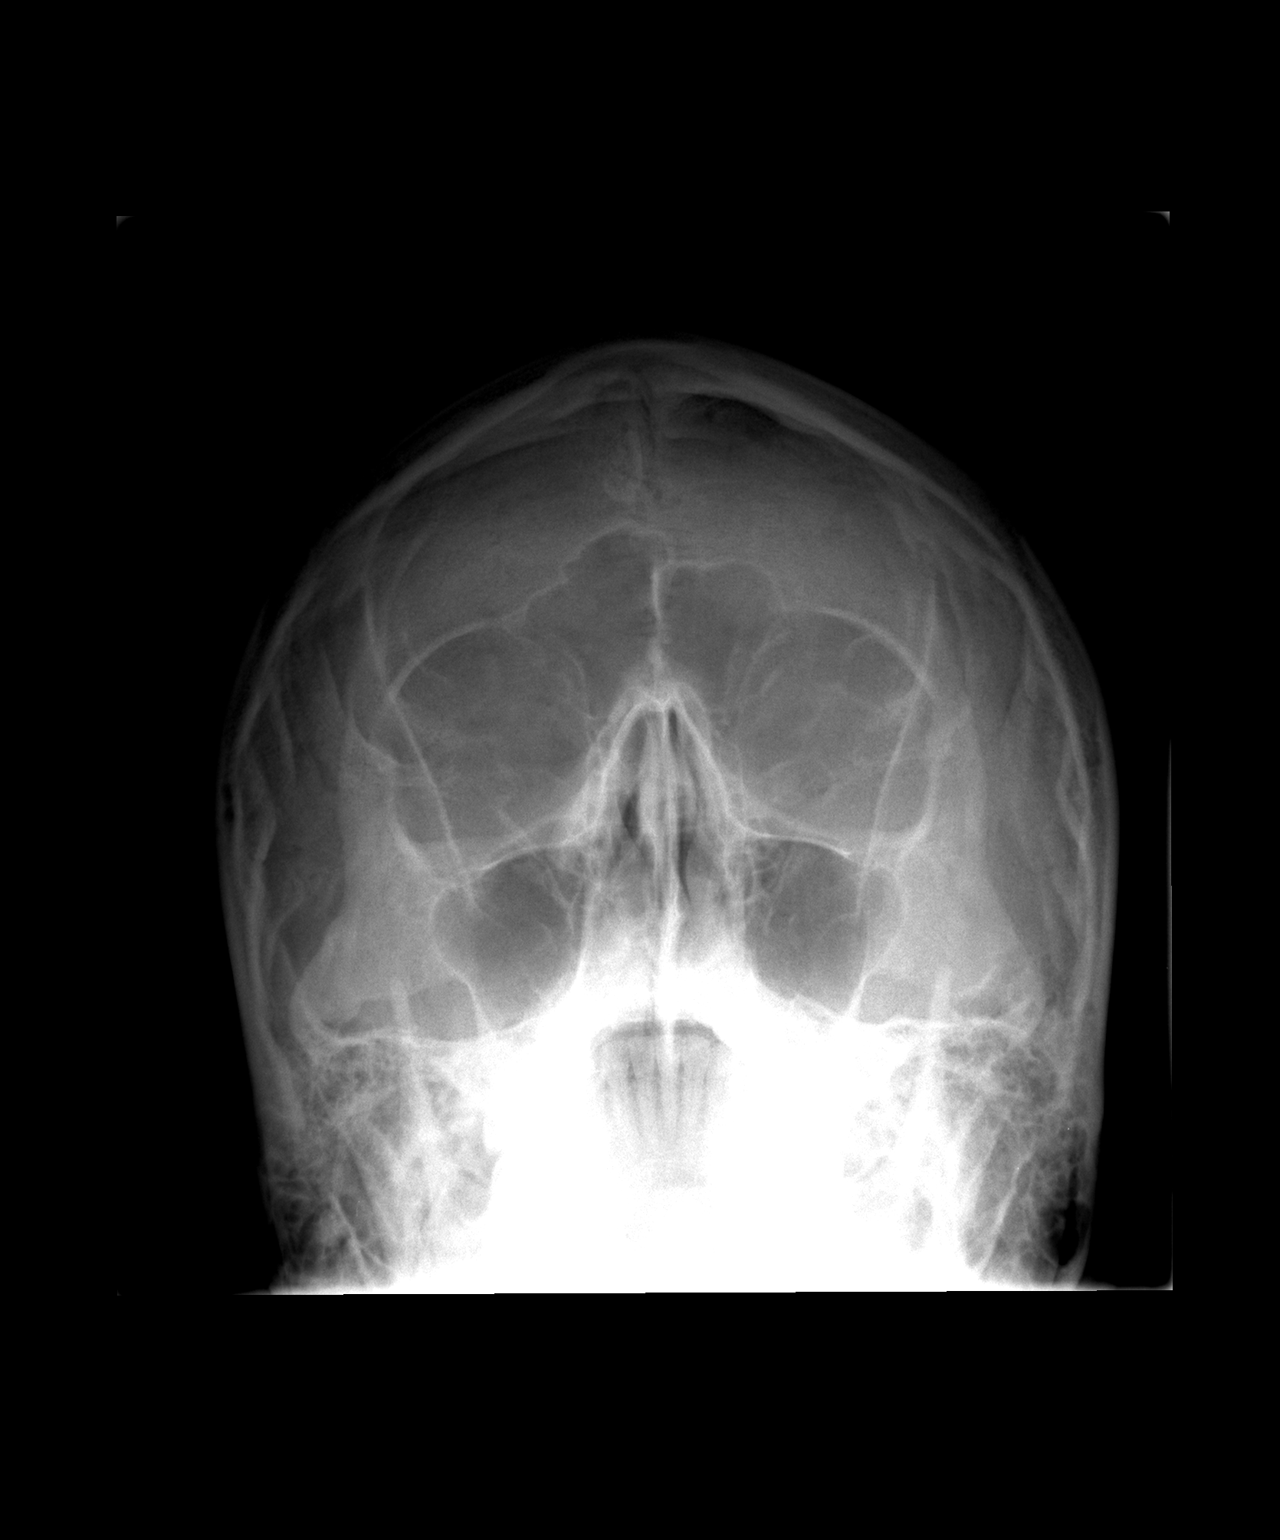

[view not recorded (2 of 2)]
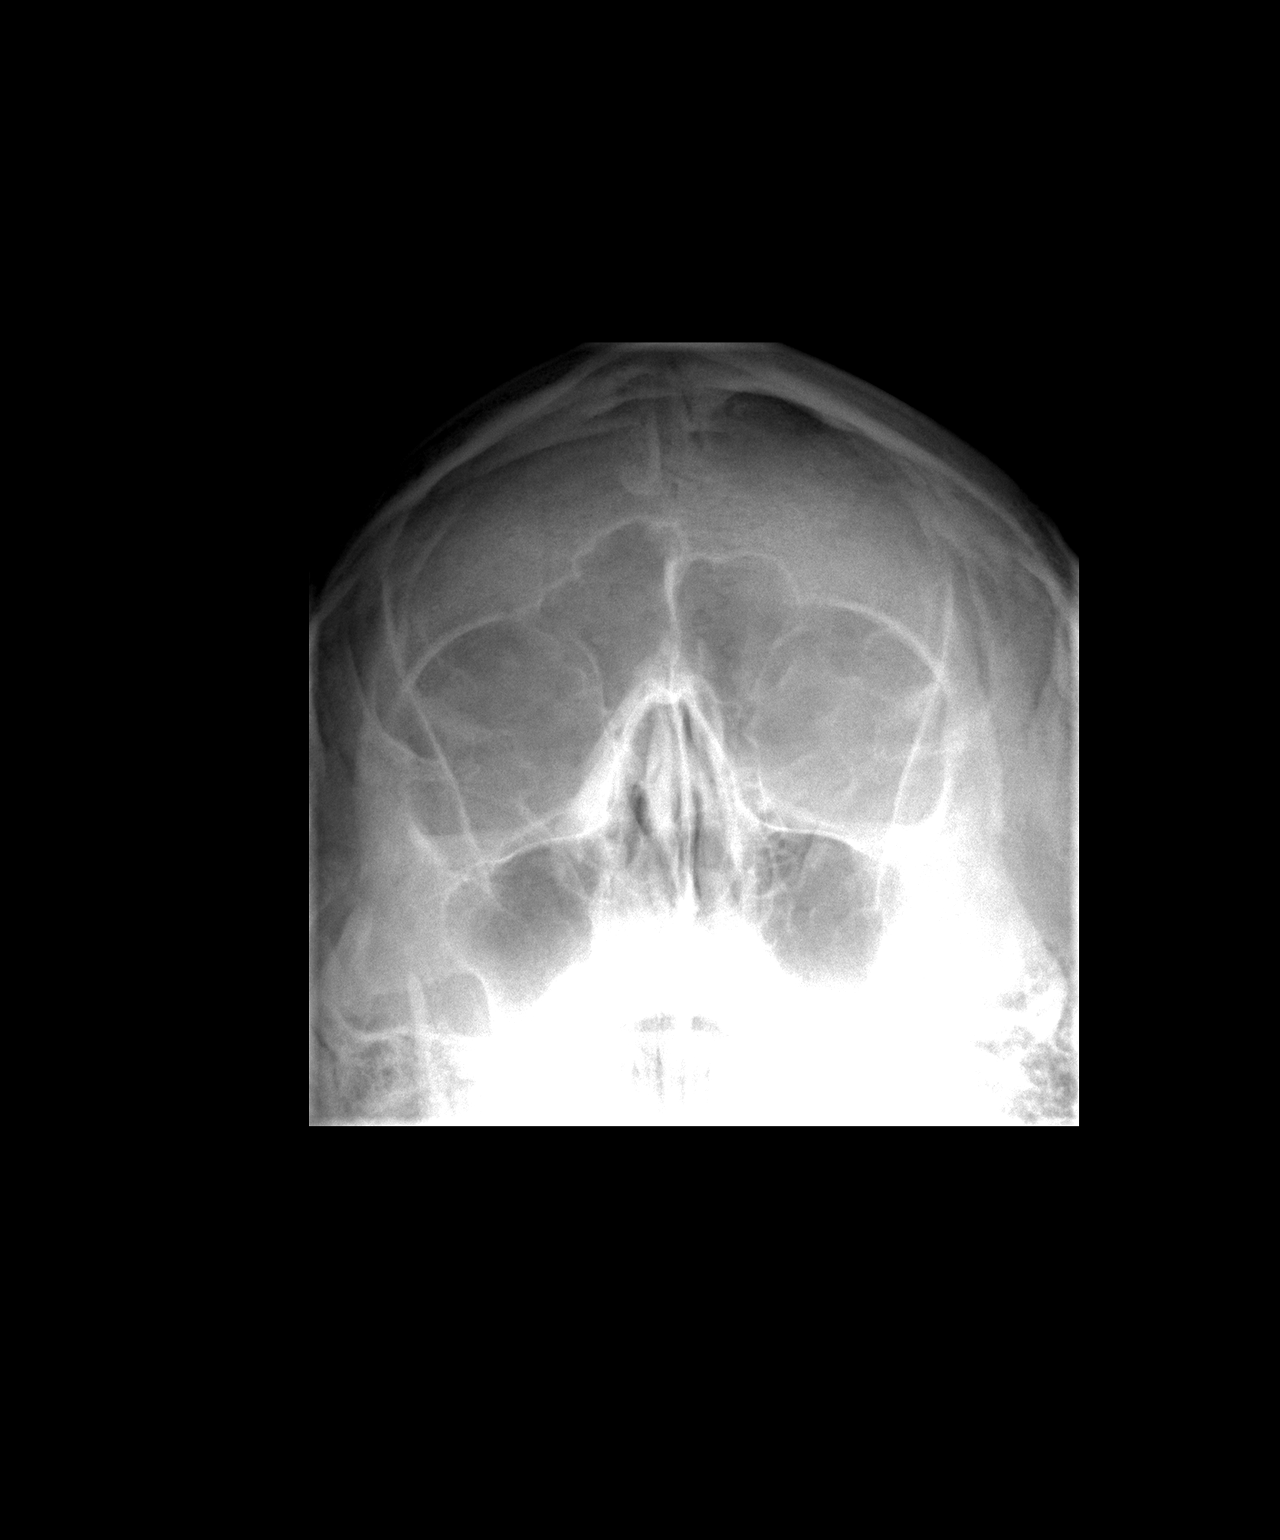

[2 of 2 positions shown; findings below may reference images not displayed]

FINDINGS: There is no evidence of metallic foreign body within the
orbits.  No significant bone abnormality identified.
IMPRESSION: No evidence of metallic foreign body within the orbits.

## 2014-02-26 IMAGING — RF DG FLUORO GUIDE NDL PLC/BX
4 series · 4 of 4 positions shown · IV contrast (multihance)
Comparison: none

CLINICAL DATA: Fall.  Right wrist pain.

Fluoroscopy Time: 40 seconds
RIGHT WRIST INJECTION UNDER FLUOROSCOPY
TECHNIQUE: An appropriate skin entrance site was determined. The
site was marked, prepped with Betadine, draped in the usual sterile
fashion, and infiltrated locally with 1% Lidocaine.  A 25 gauge
skin needle was advanced into the radiocarpal joint under
intermittent fluoroscopy.  A mixture of 0.05 ml Multihance and 10
ml of dilute Omnipaque 180 was then used to opacify the proximal
carpal joint.  No immediate complication.

[Series 1: (hospital) · 1 of 1 slices shown (1 of 4)]
[im 1/1]
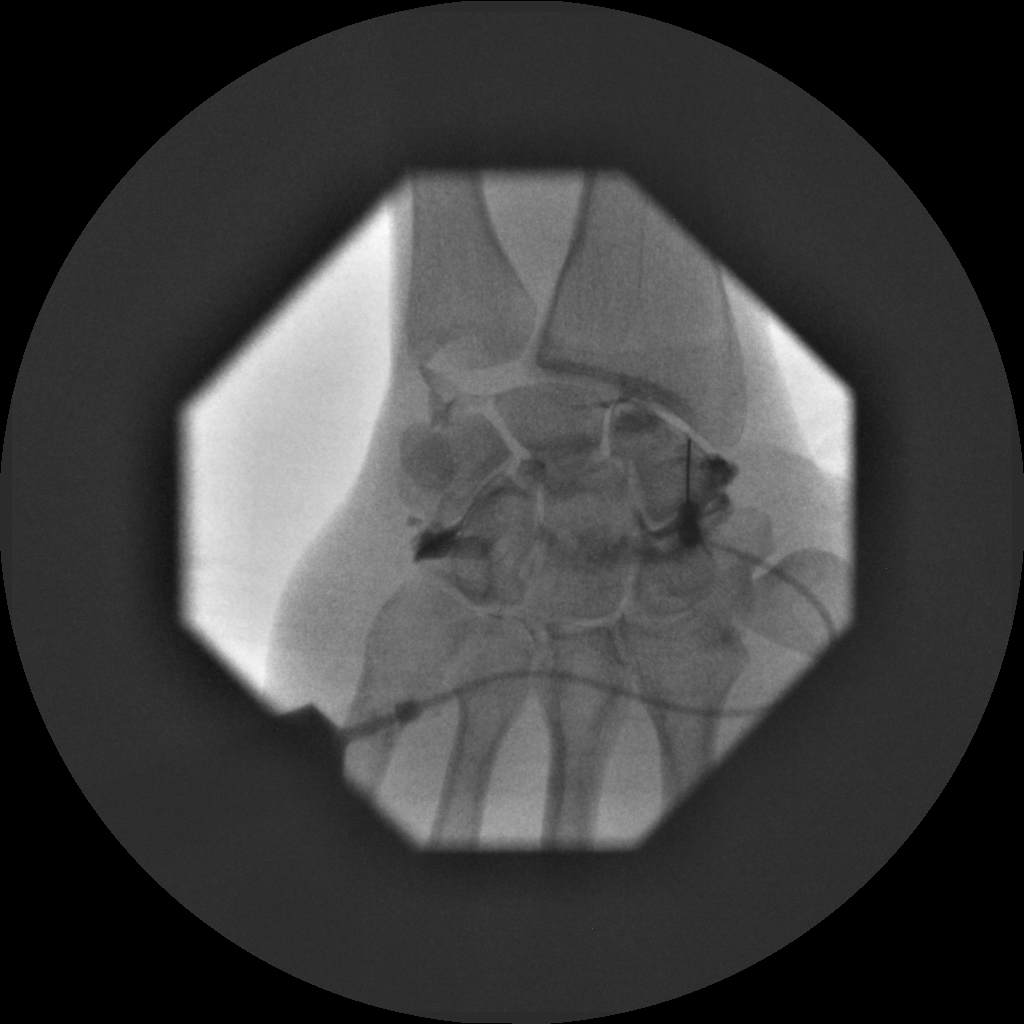

[Series 2: (hospital) · 1 of 1 slices shown (2 of 4)]
[im 1/1]
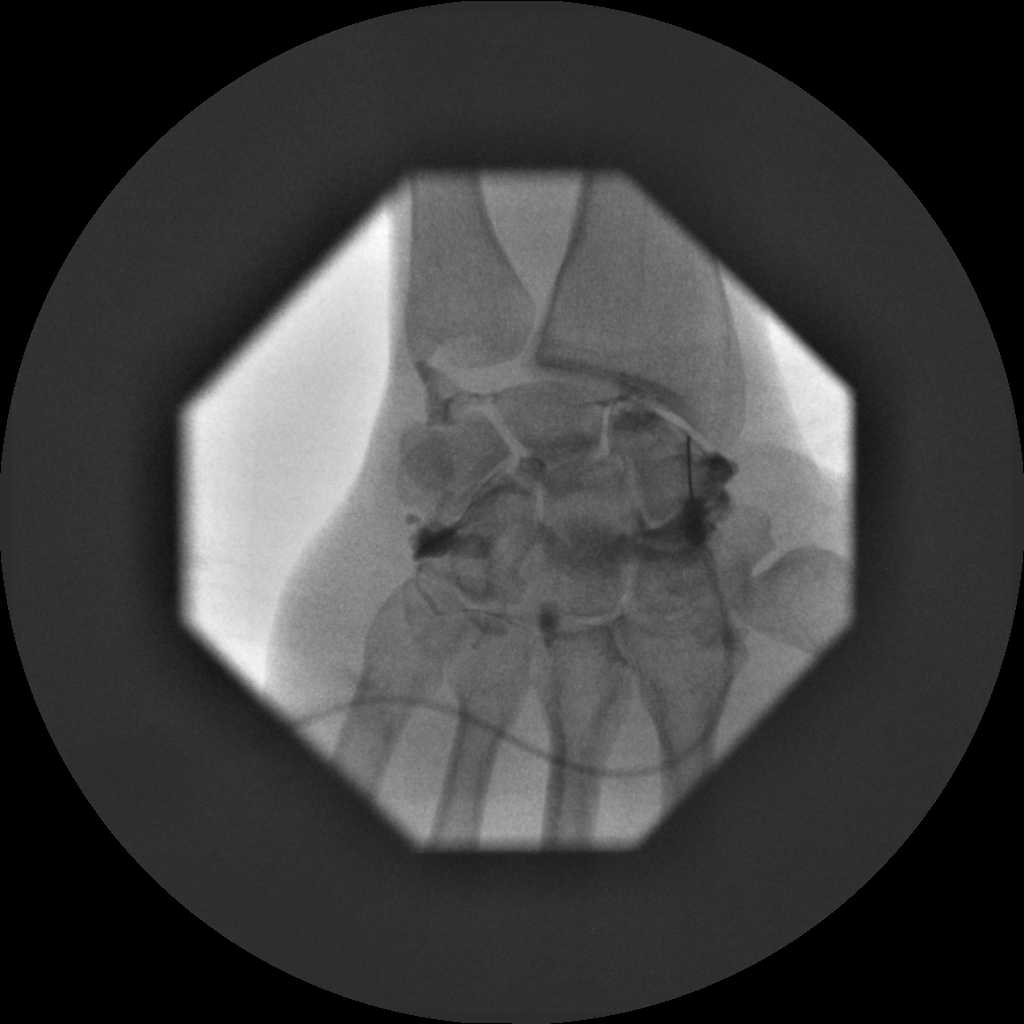

[Series 3: (hospital) · 1 of 1 slices shown (3 of 4)]
[im 1/1]
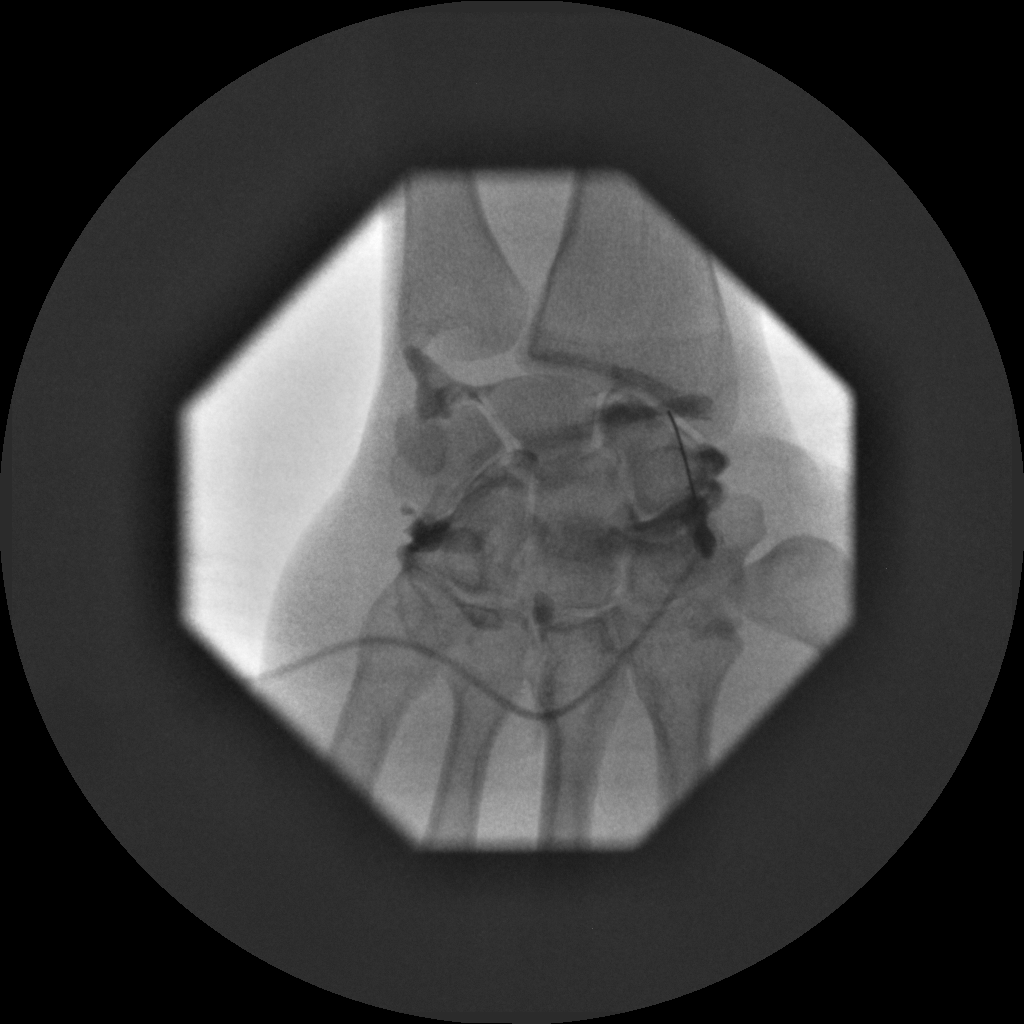

[Series 4: (hospital) · 1 of 1 slices shown (4 of 4)]
[im 1/1]
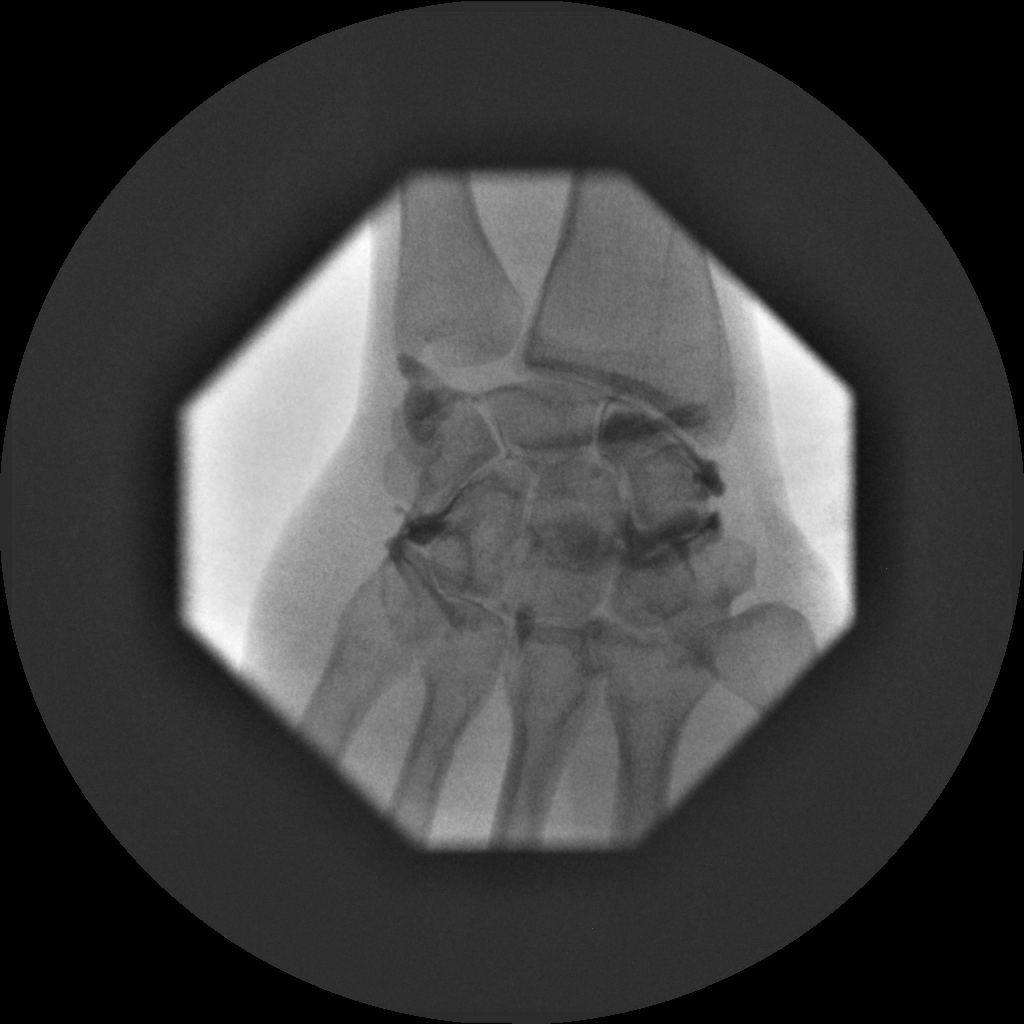

[4 of 4 positions shown; findings below may reference images not displayed]

IMPRESSION: Technically successful right wrist injection for MRI.

## 2014-05-31 ENCOUNTER — Encounter: Payer: Self-pay | Admitting: Family Medicine

## 2014-05-31 ENCOUNTER — Ambulatory Visit (INDEPENDENT_AMBULATORY_CARE_PROVIDER_SITE_OTHER): Payer: Managed Care, Other (non HMO) | Admitting: Family Medicine

## 2014-05-31 VITALS — BP 116/70 | HR 61 | Temp 97.9°F | Resp 16 | Ht 71.5 in | Wt 180.8 lb

## 2014-05-31 DIAGNOSIS — Z23 Encounter for immunization: Secondary | ICD-10-CM

## 2014-05-31 DIAGNOSIS — I10 Essential (primary) hypertension: Secondary | ICD-10-CM

## 2014-05-31 DIAGNOSIS — Z8719 Personal history of other diseases of the digestive system: Secondary | ICD-10-CM

## 2014-05-31 DIAGNOSIS — Z8619 Personal history of other infectious and parasitic diseases: Secondary | ICD-10-CM

## 2014-05-31 DIAGNOSIS — Z7189 Other specified counseling: Secondary | ICD-10-CM

## 2014-05-31 DIAGNOSIS — Z7184 Encounter for health counseling related to travel: Secondary | ICD-10-CM

## 2014-05-31 LAB — BASIC METABOLIC PANEL
BUN: 13 mg/dL (ref 6–23)
CHLORIDE: 99 meq/L (ref 96–112)
CO2: 27 meq/L (ref 19–32)
Calcium: 10 mg/dL (ref 8.4–10.5)
Creat: 1.23 mg/dL (ref 0.50–1.35)
Glucose, Bld: 79 mg/dL (ref 70–99)
Potassium: 4.1 mEq/L (ref 3.5–5.3)
Sodium: 137 mEq/L (ref 135–145)

## 2014-05-31 MED ORDER — CIPROFLOXACIN HCL 500 MG PO TABS: 500.0000 mg | ORAL_TABLET | Freq: Two times a day (BID) | ORAL | Status: DC

## 2014-05-31 MED ORDER — LISINOPRIL 20 MG PO TABS: 20.0000 mg | ORAL_TABLET | Freq: Every day | ORAL | Status: DC

## 2014-05-31 NOTE — Progress Notes (Signed)
Subjective:    Patient ID: Jerry Johnston, male    DOB: [birth year], 38 y.o.   MRN: [id number]  HPI Jerry Jerry Johnston is a 38 y.o. male  HTN -  Home BP's - 119-124/57-72. No side effects of lisinopril. Very rarely notes slight lightheaded feeling with standing.  Borderline LDL on physical in 11/2013. Based on that cholesterol reading and ASCVD calculator -4.1% 10 year risk.  Exercise -5 days per week usually.   [place]. S/p Hep A and Hep B vaccines in 2008, Tdap in 2008. HEPBSab below immunized level on last labs - will have booster today. [facility] on travel. Would like cipro rx again for traveling.  Did not use last rx that is expired.    Labs from [facility] in 11/2013.  Results for orders placed in visit on 11/30/13  GC/CHLAMYDIA PROBE AMP      Result Value Ref Range   CT Probe RNA NEGATIVE     GC Probe RNA NEGATIVE    CBC      Result Value Ref Range   WBC 4.2  4.0 - 10.5 K/uL   RBC 5.52  4.22 - 5.81 MIL/uL   Hemoglobin 16.5  13.0 - 17.0 g/dL   HCT 40.946.9  81.139.0 - 91.452.0 %   MCV 85.0  78.0 - 100.0 fL   MCH 29.9  26.0 - 34.0 pg   MCHC 35.2  30.0 - 36.0 g/dL   RDW 78.213.1  95.611.5 - 21.315.5 %   Platelets 361  150 - 400 K/uL  TSH      Result Value Ref Range   TSH 1.261  0.350 - 4.500 uIU/mL  COMPREHENSIVE METABOLIC PANEL      Result Value Ref Range   Sodium 135  135 - 145 mEq/L   Potassium 4.3  3.5 - 5.3 mEq/L   Chloride 102  96 - 112 mEq/L   CO2 24  19 - 32 mEq/L   Glucose, Bld 95  70 - 99 mg/dL   BUN 11  6 - 23 mg/dL   Creat 0.861.07  5.780.50 - 4.691.35 mg/dL   Total Bilirubin 0.7  0.3 - 1.2 mg/dL   Alkaline Phosphatase 63  39 - 117 U/L   AST 20  0 - 37 U/L   ALT 18  0 - 53 U/L   Total Protein 8.1  6.0 - 8.3 g/dL   Albumin 4.9  3.5 - 5.2 g/dL   Calcium 62.910.0  8.4 - 52.810.5 mg/dL  LIPID PANEL      Result Value Ref Range   Cholesterol 197  0 - 200 mg/dL   Triglycerides 63  <413<150 mg/dL   HDL 56  >24>39 mg/dL   Total CHOL/HDL  Ratio 3.5     VLDL 13  0 - 40 mg/dL   LDL Cholesterol 401128 (*) 0 - 99 mg/dL  HEPATITIS B SURFACE ANTIGEN      Result Value Ref Range   Hepatitis B Surface Ag NEGATIVE  NEGATIVE  HEPATITIS B SURFACE ANTIBODY, QUANTITATIVE      Result Value Ref Range   Hepatitis B-Post 4.6    HEPATITIS C ANTIBODY      Result Value Ref Range   HCV Ab NEGATIVE  NEGATIVE  HIV ANTIBODY (ROUTINE TESTING)      Result Value Ref Range   HIV NON REACTIVE  NON REACTIVE  RPR      Result Value Ref Range  RPR NON REAC  NON REAC  HSV(HERPES SIMPLEX VRS) I + II AB-IGG      Result Value Ref Range   HSV 1 Glycoprotein G Ab, IgG 2.87 (*)    HSV 2 Glycoprotein G Ab, IgG 0.10         Patient Active Problem List   Diagnosis Date Noted  . Drug allergy 03/31/2013   Past Medical History  Diagnosis Date  . Hypertension    Past Surgical History  Procedure Laterality Date  . Wrist surgery     Allergies  Allergen Reactions  . Percocet [Oxycodone-Acetaminophen]     ? Reaction pt stated he had broken out during the time of taking med.   Prior to Admission medications   Medication Sig Start Date End Date Taking? Authorizing Provider  fish oil-omega-3 fatty acids 1000 MG capsule Take 2 g by mouth daily.   Yes Historical Provider, MD  lisinopril (PRINIVIL,ZESTRIL) 20 MG tablet Take 1 tablet (20 mg total) by mouth daily. 11/30/13  Yes Shade FloodJeffrey R Lodema Parma, MD  ALPRAZolam Prudy Feeler(XANAX) 0.5 MG tablet Take 1 tablet (0.5 mg total) by mouth at bedtime as needed for sleep. 11/30/13   Shade FloodJeffrey R Alphonza Tramell, MD  Multiple Vitamin (MULTIVITAMIN) tablet Take 1 tablet by mouth daily.    Historical Provider, MD  oxycodone-acetaminophen (PERCOCET) 2.5-325 MG per tablet Take 1 tablet by mouth every 4 (four) hours as needed for pain.    Historical Provider, MD   History   Social History  . Marital Status: Single    Spouse Name: N/A    Number of Children: N/A  . Years of Education: N/A   Occupational History  . Not on file.   Social  History Main Topics  . Smoking status: Never Smoker   . Smokeless tobacco: Not on file  . Alcohol Use: Yes  . Drug Use: No  . Sexual Activity: Yes    Birth Control/ Protection: Condom   Other Topics Concern  . Not on file   Social History Narrative   Single. Education: [employer].     Review of Systems  Constitutional: Negative for fatigue and unexpected weight change.  Eyes: Negative for visual disturbance.  Respiratory: Negative for cough, chest tightness and shortness of breath.   Cardiovascular: Negative for chest pain, palpitations and leg swelling.  Gastrointestinal: Negative for abdominal pain and blood in stool.  Neurological: Negative for dizziness, light-headedness (rare as above. ) and headaches.       Objective:   Physical Exam  Vitals reviewed. Constitutional: He is oriented to person, place, and time. He appears well-developed and well-nourished.  HENT:  Head: Normocephalic and atraumatic.  Eyes: EOM are normal. Pupils are equal, round, and reactive to light.  Neck: No JVD present. Carotid bruit is not present.  Cardiovascular: Normal rate, regular rhythm and normal heart sounds.   No murmur heard. Pulmonary/Chest: Effort normal and breath sounds normal. He has no rales.  Abdominal: Soft. There is no tenderness.  Musculoskeletal: He exhibits no edema.  Neurological: He is alert and oriented to person, place, and time.  Skin: Skin is warm and dry. No rash noted.  Psychiatric: He has a normal mood and affect. His behavior is normal.   Filed Vitals:   05/31/14 1322  BP: 116/70  Pulse: 61  Temp: 97.9 F (36.6 C)  TempSrc: Oral  Resp: 16  Height: 5' 11.5" (1.816 m)  Weight: 180 lb 12.8 oz (82.01 kg)  SpO2: 99%      Assessment &  Plan:   Jerry Johnston is a 38 y.o. male HTN (hypertension) - Plan: lisinopril (PRINIVIL,ZESTRIL) 20 MG tablet, Basic metabolic panel  - stable, well controlled. Rare suspected orthostatic sx. Increase fluid intake, and if  remains low - decrease to 1/2 tablet each day. rtc precautions. rf meds 6 months, then recheck. Consider 65mo to 1 year meds if remains stable then.   Need for hepatitis B vaccination - Plan: Hepatitis B vaccine adult IM  - booster given, then can check titer at greater than 6 weeks.   H/O traveler's diarrhea - Plan: ciprofloxacin (CIPRO) 500 MG tablet  - Rx for cipro IF needed. Discussed indications for use, and preventative practices.    Counseling about travel  - UTD on Hep A, tetanus, and booster of Hep B today - CDC website to determine if other needed depending on his area of travel, but leaves in few weeks.    Meds ordered this encounter  Medications  . lisinopril (PRINIVIL,ZESTRIL) 20 MG tablet    Sig: Take 1 tablet (20 mg total) by mouth daily.    Dispense:  90 tablet    Refill:  1  . ciprofloxacin (CIPRO) 500 MG tablet    Sig: Take 1 tablet (500 mg total) by mouth 2 (two) times daily.    Dispense:  6 tablet    Refill:  0   Patient Instructions  Look at [facility].GOV - travel section to see if other vaccines recommended.  We will boost the Hep B today. You can check the status of this at your next office visit with repeat titer.  You should receive a call or letter about your lab results within the next week to 10 days.  If your blood pressures are running lower, or lightheadedness with standing - decrease to 1/2 pill per day of lisinopril. Make sure you are drinking plenty of fluids.  Return to the clinic or go to the nearest emergency room if any of your symptoms worsen or new symptoms occur. Have a good trip!

## 2014-05-31 NOTE — Patient Instructions (Addendum)
[facility] to see if other vaccines recommended.  We will boost the Hep B today. You can check the status of this at your next office visit with repeat titer.  You should receive a call or letter about your lab results within the next week to 10 days.  If your blood pressures are running lower, or lightheadedness with standing - decrease to 1/2 pill per day of lisinopril. Make sure you are drinking plenty of fluids.  Return to the clinic or go to the nearest emergency room if any of your symptoms worsen or new symptoms occur. Have a good trip!

## 2014-06-14 ENCOUNTER — Encounter: Payer: Self-pay | Admitting: Family Medicine

## 2014-06-16 NOTE — Telephone Encounter (Signed)
Looking at [facility] - overall risk of Malaria is low for [place] travelers, and the way I am reading the recommendations - mosquito prevention with protection is primary recommendation:  Per CDC "All other areas of [place] with malaria including the cities of [place], [place], [place], [place], [place], and [place]: Mosquito avoidance only."  Let me know if you are traveling to other areas, that were higher risk, and can send in Rx for doxycycline, but this is not without possible side effects either.

## 2014-11-29 ENCOUNTER — Encounter: Payer: Self-pay | Admitting: Family Medicine

## 2014-11-29 ENCOUNTER — Ambulatory Visit (INDEPENDENT_AMBULATORY_CARE_PROVIDER_SITE_OTHER): Payer: Managed Care, Other (non HMO) | Admitting: Family Medicine

## 2014-11-29 VITALS — BP 130/73 | HR 61 | Temp 98.5°F | Resp 16 | Ht 72.25 in | Wt 188.0 lb

## 2014-11-29 DIAGNOSIS — I10 Essential (primary) hypertension: Secondary | ICD-10-CM

## 2014-11-29 MED ORDER — LISINOPRIL 20 MG PO TABS: 20.0000 mg | ORAL_TABLET | Freq: Every day | ORAL | Status: DC

## 2014-11-29 NOTE — Progress Notes (Signed)
Subjective:  This chart was scribed for Meredith StaggersJeffrey Rishith Siddoway, MD by Haywood PaoNadim Abu Hashem, ED Scribe at [facility].The patient was seen in exam room 27 and the patient's care was started at 3:23 PM.   Patient ID: Jerry Johnston, male    DOB: [birth year], 38 y.o.   MRN: [id number] Chief Complaint  Patient presents with  . Hypertension    6 month follow up   HPI HPI Comments: Jerry Johnston is a 38 y.o. male who [facility] 6 month follow up for HTN medication. Overall doing very well.  HTN: Home BP range from 126-135/62-71. No side effects while on his medication. He had a cough but this has since subsided.   Health Maintenance: Work is ok, it has improved since the last visit. He believes he has a sedentary life style due to work is effecting his health. Pt is up 8 pounds from last visit in June. Pt is currently exercising 4-5 times a week for about 30 min-60 min. Pt has eaten, will put a future orders visit.  He has recently returned from [place] and [place]. He enjoyed his trip. Patient Active Problem List   Diagnosis Date Noted  . Drug allergy 03/31/2013   Past Medical History  Diagnosis Date  . Hypertension    Past Surgical History  Procedure Laterality Date  . Wrist surgery     Allergies  Allergen Reactions  . Percocet [Oxycodone-Acetaminophen]     ? Reaction pt stated he had broken out during the time of taking med.   Prior to Admission medications   Medication Sig Start Date End Date Taking? Authorizing Provider  lisinopril (PRINIVIL,ZESTRIL) 20 MG tablet Take 1 tablet (20 mg total) by mouth daily. 11/29/14  Yes Shade FloodJeffrey R Cartier Washko, MD  fish oil-omega-3 fatty acids 1000 MG capsule Take 2 g by mouth daily.    Historical Provider, MD  Multiple Vitamin (MULTIVITAMIN) tablet Take 1 tablet by mouth daily.    Historical Provider, MD   History   Social History  . Marital Status: Single    Spouse Name: N/A    Number of Children: N/A  . Years of  Education: N/A   Occupational History  . Not on file.   Social History Main Topics  . Smoking status: Never Smoker   . Smokeless tobacco: Not on file  . Alcohol Use: Yes  . Drug Use: No  . Sexual Activity: Yes    Birth Control/ Protection: Condom   Other Topics Concern  . Not on file   Social History Narrative   Single. Education: [employer].   Review of Systems  Constitutional: Negative for fatigue and unexpected weight change.  Eyes: Negative for visual disturbance.  Respiratory: Negative for cough, chest tightness and shortness of breath.   Cardiovascular: Negative for chest pain, palpitations and leg swelling.  Gastrointestinal: Negative for abdominal pain and blood in stool.  Neurological: Negative for dizziness, light-headedness and headaches.      Objective:  BP 130/73 mmHg  Pulse 61  Temp(Src) 98.5 F (36.9 C) (Oral)  Resp 16  Ht 6' 0.25" (1.835 m)  Wt 188 lb (85.276 kg)  BMI 25.33 kg/m2  SpO2 99%  Physical Exam  Constitutional: He is oriented to person, place, and time. He appears well-developed and well-nourished.  HENT:  Head: Normocephalic and atraumatic.  Eyes: EOM are normal. Pupils are equal, round, and reactive to light.  Neck: No JVD present. Carotid bruit is not present.  Cardiovascular: Normal rate, regular rhythm and normal heart sounds.   No murmur heard. Pulmonary/Chest: Effort normal and breath sounds normal. He has no rales.  Musculoskeletal: He exhibits no edema.  Neurological: He is alert and oriented to person, place, and time.  Skin: Skin is warm and dry.  Psychiatric: He has a normal mood and affect.  Vitals reviewed.     Assessment & Plan:   Jerry Johnston is a 38 y.o. male Essential hypertension - Plan: lisinopril (PRINIVIL,ZESTRIL) 20 MG tablet, COMPLETE METABOLIC PANEL WITH GFR, Lipid panel  -overall stable. Cont same regimen.  Lipids/lytes pending (fasting lab visit - order placed).  Continue exercise and watch diet.    Meds  ordered this encounter  Medications  . lisinopril (PRINIVIL,ZESTRIL) 20 MG tablet    Sig: Take 1 tablet (20 mg total) by mouth daily.    Dispense:  90 tablet    Refill:  1   Patient Instructions  Return in next week for fasting labs. You should receive a call or letter about your lab results within the next week to 10 days. Follow up in next 6-9 months.    I personally performed the services described in this documentation, which was scribed in my presence. The recorded information has been reviewed and considered, and addended by me as needed.

## 2014-11-29 NOTE — Patient Instructions (Signed)
Return in next week for fasting labs. You should receive a call or letter about your lab results within the next week to 10 days. Follow up in next 6-9 months.

## 2014-12-08 ENCOUNTER — Other Ambulatory Visit (INDEPENDENT_AMBULATORY_CARE_PROVIDER_SITE_OTHER): Payer: Managed Care, Other (non HMO) | Admitting: *Deleted

## 2014-12-08 DIAGNOSIS — I10 Essential (primary) hypertension: Secondary | ICD-10-CM

## 2014-12-08 LAB — COMPLETE METABOLIC PANEL WITH GFR
ALBUMIN: 4.4 g/dL (ref 3.5–5.2)
ALT: 21 U/L (ref 0–53)
AST: 27 U/L (ref 0–37)
Alkaline Phosphatase: 60 U/L (ref 39–117)
BILIRUBIN TOTAL: 0.5 mg/dL (ref 0.2–1.2)
BUN: 12 mg/dL (ref 6–23)
CO2: 25 mEq/L (ref 19–32)
Calcium: 9.8 mg/dL (ref 8.4–10.5)
Chloride: 103 mEq/L (ref 96–112)
Creat: 1.2 mg/dL (ref 0.50–1.35)
GFR, Est African American: 89 mL/min
GFR, Est Non African American: 77 mL/min
Glucose, Bld: 81 mg/dL (ref 70–99)
Potassium: 4.9 mEq/L (ref 3.5–5.3)
Sodium: 137 mEq/L (ref 135–145)
Total Protein: 7.5 g/dL (ref 6.0–8.3)

## 2014-12-08 LAB — LIPID PANEL
Cholesterol: 198 mg/dL (ref 0–200)
HDL: 51 mg/dL (ref >39)
LDL CALC: 128 mg/dL — AB (ref 0–99)
TRIGLYCERIDES: 96 mg/dL (ref <150)
Total CHOL/HDL Ratio: 3.9 Ratio
VLDL: 19 mg/dL (ref 0–40)

## 2014-12-08 NOTE — Progress Notes (Signed)
Pt here for lab draw only  

## 2014-12-21 ENCOUNTER — Encounter: Payer: Self-pay | Admitting: *Deleted

## 2015-06-13 ENCOUNTER — Encounter: Payer: Self-pay | Admitting: Family Medicine

## 2015-06-13 ENCOUNTER — Ambulatory Visit (INDEPENDENT_AMBULATORY_CARE_PROVIDER_SITE_OTHER): Payer: BLUE CROSS/BLUE SHIELD | Admitting: Family Medicine

## 2015-06-13 VITALS — BP 121/73 | HR 66 | Temp 98.0°F | Resp 16 | Ht 72.25 in | Wt 192.2 lb

## 2015-06-13 DIAGNOSIS — I10 Essential (primary) hypertension: Secondary | ICD-10-CM | POA: Diagnosis not present

## 2015-06-13 MED ORDER — LISINOPRIL 20 MG PO TABS: 20.0000 mg | ORAL_TABLET | Freq: Every day | ORAL | Status: DC

## 2015-06-13 NOTE — Progress Notes (Signed)
Subjective:  This chart was scribed for Jerry Flood, MD by Jerry Johnston, at [facility].  This patient was seen in room 21 and the patient's care was started at 4:54 PM.    Patient ID: Jerry Johnston, male    DOB: Feb 01, 1976, 39 y.o.   MRN: [id number]  HPI  HPI Comments: Jerry Johnston is a 39 y.o. male who [facility] the [facility] for a follow up for hypertension.  Last visit was December 2015.  Blood pressure was controlled at that visit and was continued on Lisinopril 20 mg QD. Fast lab work was completed after visit. Minimally elevated LDL but overall stable and based on 10 year ASCVD risk, did not need to start statin medication. His home blood pressure readings have ranged from 114 - 132/ 66 - 73.  ----- Patient does not have any questions or concerns regarding his medications.  Denies light headedness/ dizziness or a cough  Patient has purchased a fit bit and is trying to be more active.  He has noticed that he actually looks more fit and weighed less around 175 when he went on vacation.  He notes that he wieghs more and feels less fit when he is at home. Patient goes to the gym 4-5 times a week and denies any chest tightness/pain when he does.  Denies any swelling in his legs but does get a cramp in his left shin at times. Patient has 2-3 drinks per week.     Results for orders placed or performed in visit on 12/08/14  COMPLETE METABOLIC PANEL WITH GFR  Result Value Ref Range   Sodium 137 135 - 145 mEq/L   Potassium 4.9 3.5 - 5.3 mEq/L   Chloride 103 96 - 112 mEq/L   CO2 25 19 - 32 mEq/L   Glucose, Bld 81 70 - 99 mg/dL   BUN 12 6 - 23 mg/dL   Creat 9.23 3.00 - 7.62 mg/dL   Total Bilirubin 0.5 0.2 - 1.2 mg/dL   Alkaline Phosphatase 60 39 - 117 U/L   AST 27 0 - 37 U/L   ALT 21 0 - 53 U/L   Total Protein 7.5 6.0 - 8.3 g/dL   Albumin 4.4 3.5 - 5.2 g/dL   Calcium 9.8 8.4 - 26.3 mg/dL   GFR, Est African American 89 mL/min   GFR, Est Non African American 77 mL/min  Lipid panel  Result Value Ref Range   Cholesterol 198 0 - 200 mg/dL   Triglycerides 96 <335 mg/dL   HDL 51 >45 mg/dL   Total CHOL/HDL Ratio 3.9 Ratio   VLDL 19 0 - 40 mg/dL   LDL Cholesterol 625 (H) 0 - 99 mg/dL    Patient Active Problem List   Diagnosis Date Noted  . Drug allergy 03/31/2013   Past Medical History  Diagnosis Date  . Hypertension    Past Surgical History  Procedure Laterality Date  . Wrist surgery     Allergies  Allergen Reactions  . Percocet [Oxycodone-Acetaminophen]     ? Reaction pt stated he had broken out during the time of taking med.   Prior to Admission medications   Medication Sig Start Date End Date Taking? Authorizing Provider  fish oil-omega-3 fatty acids 1000 MG capsule Take 2 g by mouth daily.   Yes Historical Provider, MD  lisinopril (PRINIVIL,ZESTRIL) 20 MG tablet Take 1 tablet (20 mg total) by mouth daily. 11/29/14  Yes Tinnie Gens  Earnestine Leys, MD  Multiple Vitamin (MULTIVITAMIN) tablet Take 1 tablet by mouth daily.   Yes Historical Provider, MD   History   Social History  . Marital Status: Single    Spouse Name: N/A  . Number of Children: N/A  . Years of Education: N/A   Occupational History  . Not on file.   Social History Main Topics  . Smoking status: Never Smoker   . Smokeless tobacco: Not on file  . Alcohol Use: Yes  . Drug Use: No  . Sexual Activity: Yes    Birth Control/ Protection: Condom   Other Topics Concern  . Not on file   Social History Narrative   Single. Education: [employer].     Review of Systems  Constitutional: Negative for fatigue and unexpected weight change.  Eyes: Negative for visual disturbance.  Respiratory: Negative for cough, chest tightness and shortness of breath.   Cardiovascular: Negative for chest pain, palpitations and leg swelling.  Gastrointestinal: Negative for abdominal pain and blood in stool.  Neurological: Negative for dizziness, light-headedness  and headaches.       Objective:   Physical Exam  Constitutional: He is oriented to person, place, and time. He appears well-developed and well-nourished.  HENT:  Head: Normocephalic and atraumatic.  Eyes: EOM are normal. Pupils are equal, round, and reactive to light.  Neck: No JVD present. Carotid bruit is not present.  Cardiovascular: Normal rate, regular rhythm and normal heart sounds.   No murmur heard. Pulmonary/Chest: Effort normal and breath sounds normal. He has no rales.  Musculoskeletal: He exhibits no edema.  Neurological: He is alert and oriented to person, place, and time.  Skin: Skin is warm and dry.  Psychiatric: He has a normal mood and affect.  Vitals reviewed.  Filed Vitals:   06/13/15 1609  BP: 121/73  Pulse: 66  Temp: 98 F (36.7 C)  TempSrc: Oral  Resp: 16  Height: 6' 0.25" (1.835 m)  Weight: 192 lb 3.2 oz (87.181 kg)  SpO2: 98%       Assessment & Plan:   Jerry Johnston is a 39 y.o. male Essential hypertension - Plan: lisinopril (PRINIVIL,ZESTRIL) 20 MG tablet  - controlled. Continue exercise/physical activity and plan on bloodwork at physical in next 6 months.  Refilled lisinopril at same dose - next 6 months.   Meds ordered this encounter  Medications  . lisinopril (PRINIVIL,ZESTRIL) 20 MG tablet    Sig: Take 1 tablet (20 mg total) by mouth daily.    Dispense:  90 tablet    Refill:  1   Patient Instructions  Plan on physical in next 6 months and can check blood work then (including cholesterol - so fasting for 8 hours is ideal).  Return to the clinic or go to the nearest emergency room if any of your symptoms worsen or new symptoms occur.     I personally performed the services described in this documentation, which was scribed in my presence. The recorded information has been reviewed and considered, and addended by me as needed.

## 2015-06-13 NOTE — Patient Instructions (Signed)
Plan on physical in next 6 months and can check blood work then (including cholesterol - so fasting for 8 hours is ideal).  Return to the clinic or go to the nearest emergency room if any of your symptoms worsen or new symptoms occur.

## 2015-12-05 ENCOUNTER — Ambulatory Visit (INDEPENDENT_AMBULATORY_CARE_PROVIDER_SITE_OTHER): Payer: BLUE CROSS/BLUE SHIELD | Admitting: Family Medicine

## 2015-12-05 ENCOUNTER — Encounter: Payer: Self-pay | Admitting: Family Medicine

## 2015-12-05 VITALS — BP 126/70 | HR 74 | Temp 97.7°F | Resp 16 | Ht 72.5 in | Wt 187.4 lb

## 2015-12-05 DIAGNOSIS — Z0000 Encounter for general adult medical examination without abnormal findings: Secondary | ICD-10-CM

## 2015-12-05 DIAGNOSIS — Z872 Personal history of diseases of the skin and subcutaneous tissue: Secondary | ICD-10-CM

## 2015-12-05 DIAGNOSIS — E785 Hyperlipidemia, unspecified: Secondary | ICD-10-CM

## 2015-12-05 DIAGNOSIS — Z113 Encounter for screening for infections with a predominantly sexual mode of transmission: Secondary | ICD-10-CM

## 2015-12-05 DIAGNOSIS — Z23 Encounter for immunization: Secondary | ICD-10-CM

## 2015-12-05 DIAGNOSIS — I10 Essential (primary) hypertension: Secondary | ICD-10-CM | POA: Diagnosis not present

## 2015-12-05 LAB — POCT URINALYSIS DIP (MANUAL ENTRY)
Bilirubin, UA: NEGATIVE
Glucose, UA: NEGATIVE
Ketones, POC UA: NEGATIVE
Leukocytes, UA: NEGATIVE
Nitrite, UA: NEGATIVE
PROTEIN UA: NEGATIVE
SPEC GRAV UA: 1.02
UROBILINOGEN UA: 0.2
pH, UA: 6

## 2015-12-05 LAB — COMPLETE METABOLIC PANEL WITH GFR
ALK PHOS: 57 U/L (ref 40–115)
ALT: 26 U/L (ref 9–46)
AST: 30 U/L (ref 10–40)
Albumin: 4.4 g/dL (ref 3.6–5.1)
BUN: 16 mg/dL (ref 7–25)
CALCIUM: 9.4 mg/dL (ref 8.6–10.3)
CO2: 28 mmol/L (ref 20–31)
CREATININE: 1.11 mg/dL (ref 0.60–1.35)
Chloride: 100 mmol/L (ref 98–110)
GFR, Est African American: >89 mL/min (ref >=60)
GFR, Est Non African American: 84 mL/min (ref >=60)
GLUCOSE: 75 mg/dL (ref 65–99)
Potassium: 4.7 mmol/L (ref 3.5–5.3)
Sodium: 136 mmol/L (ref 135–146)
Total Bilirubin: 0.6 mg/dL (ref 0.2–1.2)
Total Protein: 7.8 g/dL (ref 6.1–8.1)

## 2015-12-05 LAB — LIPID PANEL
CHOL/HDL RATIO: 3.5 ratio (ref <=5.0)
Cholesterol: 220 mg/dL — ABNORMAL HIGH (ref 125–200)
HDL: 63 mg/dL (ref >=40)
LDL Cholesterol: 145 mg/dL — ABNORMAL HIGH (ref <130)
Triglycerides: 61 mg/dL (ref <150)
VLDL: 12 mg/dL (ref <30)

## 2015-12-05 NOTE — Patient Instructions (Addendum)
You should receive a call or letter about your lab results within the next week to 10 days. If cholesterol is borderline - may still be able to watch diet/exercise depending on reading.   I do not see a significant rash or concerns on exam today.  You likely had a reaction to the sun due to lisinopril making you more photosensitive, so be careful with extended sun exposure.   Follow up in next 6 months. Return to the clinic or go to the nearest emergency room if any of your symptoms worsen or new symptoms occur.  Keeping you healthy  Get these tests  Blood pressure- Have your blood pressure checked once a year by your healthcare provider.  Normal blood pressure is 120/80.  Weight- Have your body mass index (BMI) calculated to screen for obesity.  BMI is a measure of body fat based on height and weight. You can also calculate your own BMI at https://www.west-esparza.com/www.nhlbisupport.com/bmi/.  Cholesterol- Have your cholesterol checked regularly starting at age 37, sooner may be necessary if you have diabetes, high blood pressure, if a family member developed heart diseases at an early age or if you smoke.   Chlamydia, HIV, and other sexual transmitted disease- Get screened each year until the age of 37 then within three months of each new sexual partner.  Diabetes- Have your blood sugar checked regularly if you have high blood pressure, high cholesterol, a family history of diabetes or if you are overweight.  Get these vaccines  Flu shot- Every fall.  Tetanus shot- Every 10 years.  Menactra- Single dose; prevents meningitis.  Take these steps  Don't smoke- If you do smoke, ask your healthcare provider about quitting. For tips on how to quit, go to www.smokefree.gov or call 1-800-QUIT-NOW.  Be physically active- Exercise 5 days a week for at least 30 minutes.  If you are not already physically active start slow and gradually work up to 30 minutes of moderate physical activity.  Examples of moderate activity  include walking briskly, mowing the yard, dancing, swimming bicycling, etc.  Eat a healthy diet- Eat a variety of healthy foods such as fruits, vegetables, low fat milk, low fat cheese, yogurt, lean meats, poultry, fish, beans, tofu, etc.  For more information on healthy eating, go to www.thenutritionsource.org  Drink alcohol in moderation- Limit alcohol intake two drinks or less a day.  Never drink and drive.  Dentist- Brush and floss teeth twice daily; visit your dentis twice a year.  Depression-Your emotional health is as important as your physical health.  If you're feeling down, losing interest in things you normally enjoy please talk with your healthcare provider.  Gun Safety- If you keep a gun in your home, keep it unloaded and with the safety lock on.  Bullets should be stored separately.  Helmet use- Always wear a helmet when riding a motorcycle, bicycle, rollerblading or skateboarding.  Safe sex- If you may be exposed to a sexually transmitted infection, use a condom  Seat belts- Seat bels can save your life; always wear one.  Smoke/Carbon Monoxide detectors- These detectors need to be installed on the appropriate level of your home.  Replace batteries at least once a year.  Skin Cancer- When out in the sun, cover up and use sunscreen SPF 15 or higher.  Violence- If anyone is threatening or hurting you, please tell your healthcare provider.

## 2015-12-05 NOTE — Progress Notes (Signed)
Subjective:  This chart was scribed for [name], MD by [name], Medical Scribe. This patient was seen in room 25 and the patient's care was started 3:37 PM.   Patient ID: Jerry Johnston, male    DOB: 1976-07-08, 39 y.o.   MRN: [id number] Chief Complaint  Patient presents with  . Annual Exam  . Medication Refill    Lisinopril 20 mg   HPI Jerry Johnston is a 39 y.o. male Here for annual physical. He recently came back from vacation to [place] and [place].   HTN H/o HTN, takes lisinopril 20 mg qd.  Lab Results  Component Value Date   CREATININE 1.20 12/08/2014   HLD Slightly elevated LDL.  Lab Results  Component Value Date   CHOL 198 12/08/2014   HDL 51 12/08/2014   LDLCALC 128* 12/08/2014   TRIG 96 12/08/2014   CHOLHDL 3.9 12/08/2014   Wt Readings from Last 3 Encounters:  12/05/15 187 lb 6.4 oz (85.004 kg)  06/13/15 192 lb 3.2 oz (87.181 kg)  11/29/14 188 lb (85.276 kg)   Cancer Screening Paternal grandfather and maternal grandmother had cancers.   Immunizations Immunization History  Administered Date(s) Administered  . Hepatitis A 03/28/2007, 09/30/2007  . Hepatitis B 03/28/2007, 04/28/2007, 09/30/2007, 05/31/2014  . Hepatitis B, adult/adol-2 dose 05/31/2014  . Influenza Split 09/30/2013, 09/30/2014  . Influenza,inj,Quad PF,36+ Mos 12/05/2015  . Tdap 03/28/2007   STI testing Negative/normal 2014.  New sexual partners since last testing, condoms used.   Dentist He sees dentist every 6 months.   Vision No exam data present He has family history of glaucoma (his maternal grandfather, at older age). He hasn't seen eye doctor this year.   Exercise He exercises 4-5 times a week. He bought a fitbit and measured his steps. He found it hard to reaching his goal while [place]. He found it easier when traveling [place]. Past 2 days, he's been on treadmill and hasn't come close to his numbers while on vacation.   Depression Depression  screen [facility] 2/9 12/05/2015 06/13/2015 05/31/2014  Decreased Interest 0 0 0  Down, Depressed, Hopeless 0 0 0  PHQ - 2 Score 0 0 0   Skin He had some sunburn while traveling on vacation about a 1-2 weeks ago. He first noticed it being dried and peeling. It's peeled and now it's darker.   Patient Active Problem List   Diagnosis Date Noted  . Drug allergy 03/31/2013   Past Medical History  Diagnosis Date  . Hypertension    Past Surgical History  Procedure Laterality Date  . Wrist surgery     Allergies  Allergen Reactions  . Percocet [Oxycodone-Acetaminophen]     ? Reaction pt stated he had broken out during the time of taking med.   Prior to Admission medications   Medication Sig Start Date End Date Taking? Authorizing Provider  fish oil-omega-3 fatty acids 1000 MG capsule Take 2 g by mouth daily.    Historical Provider, MD  lisinopril (PRINIVIL,ZESTRIL) 20 MG tablet Take 1 tablet (20 mg total) by mouth daily. 06/13/15   [name], MD  Multiple Vitamin (MULTIVITAMIN) tablet Take 1 tablet by mouth daily.    Historical Provider, MD   Social History   Social History  . Marital Status: Single    Spouse Name: N/A  . Number of Children: N/A  . Years of Education: N/A   Occupational History  . Psychiatric nurse    Social History Main  Topics  . Smoking status: Never Smoker   . Smokeless tobacco: Not on file  . Alcohol Use: 1.2 - 1.8 oz/week    2-3 Standard drinks or equivalent per week  . Drug Use: No  . Sexual Activity: Yes    Birth Control/ Protection: Condom   Other Topics Concern  . Not on file   Social History Narrative   Single. Education: [employer].   Exercise: Yes   Review of Systems  Constitutional: Negative for fatigue and unexpected weight change.  Eyes: Negative for visual disturbance.  Respiratory: Negative for cough, chest tightness and shortness of breath.   Cardiovascular: Negative for chest pain, palpitations and leg swelling.  Gastrointestinal: Negative  for abdominal pain and blood in stool.  Genitourinary: Negative for hematuria, discharge and penile pain.  Neurological: Negative for dizziness, light-headedness and headaches.      Objective:   Physical Exam  Constitutional: He is oriented to person, place, and time. He appears well-developed and well-nourished.  HENT:  Head: Normocephalic and atraumatic.  Eyes: EOM are normal. Pupils are equal, round, and reactive to light.  Neck: No JVD present. Carotid bruit is not present.  Cardiovascular: Normal rate, regular rhythm and normal heart sounds.   No murmur heard. Pulmonary/Chest: Effort normal and breath sounds normal. He has no rales.  Musculoskeletal: He exhibits no edema.  Neurological: He is alert and oriented to person, place, and time.  Skin: Skin is warm and dry.  Minimal hyperpigmentation just over right upper scapula but very minimal, skin intact  Psychiatric: He has a normal mood and affect.  Vitals reviewed.   Filed Vitals:   12/05/15 1438  BP: 126/70  Pulse: 74  Temp: 97.7 F (36.5 C)  TempSrc: Oral  Resp: 16  Height: 6' 0.5" (1.842 m)  Weight: 187 lb 6.4 oz (85.004 kg)  SpO2: 99%      Assessment & Plan:  Jerry Johnston is a 39 y.o. male Annual physical exam  -anticipatory guidance as below in AVS, screening labs above. Health maintenance items as above in HPI discussed/recommended as applicable.   Flu vaccine need - Plan: Flu Vaccine QUAD 36+ mos IM given  Essential hypertension - Plan: COMPLETE METABOLIC PANEL WITH GFR, Lipid panel, POCT urinalysis dipstick  -stable. nmo med changes, labs pending  Routine screening for STI (sexually transmitted infection) - Plan: RPR, GC/Chlamydia Probe Amp, HIV antibody  -continue condoms/safer sex practices. STI testing pending.   Hyperlipidemia - Plan: COMPLETE METABOLIC PANEL WITH GFR, Lipid panel  -mild prior. Repeat testing to decide on meds if needed.   H/O sunburn  -healing/healed now. Probable  photosensitivity from lisinopril. cautioned on this risk in future.   No orders of the defined types were placed in this encounter.   Patient Instructions  You should receive a call or letter about your lab results within the next week to 10 days. If cholesterol is borderline - may still be able to watch diet/exercise depending on reading.   I do not see a significant rash or concerns on exam today.  You likely had a reaction to the sun due to lisinopril making you more photosensitive, so be careful with extended sun exposure.   Follow up in next 6 months. Return to the clinic or go to the nearest emergency room if any of your symptoms worsen or new symptoms occur.  Keeping you healthy  Get these tests  Blood pressure- Have your blood pressure checked once a year by your healthcare provider.  Normal blood pressure is 120/80.  Weight- Have your body mass index (BMI) calculated to screen for obesity.  BMI is a measure of body fat based on height and weight. You can also calculate your own BMI at https://www.west-esparza.com/www.nhlbisupport.com/bmi/.  Cholesterol- Have your cholesterol checked regularly starting at age 37, sooner may be necessary if you have diabetes, high blood pressure, if a family member developed heart diseases at an early age or if you smoke.   Chlamydia, HIV, and other sexual transmitted disease- Get screened each year until the age of 37 then within three months of each new sexual partner.  Diabetes- Have your blood sugar checked regularly if you have high blood pressure, high cholesterol, a family history of diabetes or if you are overweight.  Get these vaccines  Flu shot- Every fall.  Tetanus shot- Every 10 years.  Menactra- Single dose; prevents meningitis.  Take these steps  Don't smoke- If you do smoke, ask your healthcare provider about quitting. For tips on how to quit, go to www.smokefree.gov or call 1-800-QUIT-NOW.  Be physically active- Exercise 5 days a week for at least 30  minutes.  If you are not already physically active start slow and gradually work up to 30 minutes of moderate physical activity.  Examples of moderate activity include walking briskly, mowing the yard, dancing, swimming bicycling, etc.  Eat a healthy diet- Eat a variety of healthy foods such as fruits, vegetables, low fat milk, low fat cheese, yogurt, lean meats, poultry, fish, beans, tofu, etc.  For more information on healthy eating, go to www.thenutritionsource.org  Drink alcohol in moderation- Limit alcohol intake two drinks or less a day.  Never drink and drive.  Dentist- Brush and floss teeth twice daily; visit your dentis twice a year.  Depression-Your emotional health is as important as your physical health.  If you're feeling down, losing interest in things you normally enjoy please talk with your healthcare provider.  Gun Safety- If you keep a gun in your home, keep it unloaded and with the safety lock on.  Bullets should be stored separately.  Helmet use- Always wear a helmet when riding a motorcycle, bicycle, rollerblading or skateboarding.  Safe sex- If you may be exposed to a sexually transmitted infection, use a condom  Seat belts- Seat bels can save your life; always wear one.  Smoke/Carbon Monoxide detectors- These detectors need to be installed on the appropriate level of your home.  Replace batteries at least once a year.  Skin Cancer- When out in the sun, cover up and use sunscreen SPF 15 or higher.  Violence- If anyone is threatening or hurting you, please tell your healthcare provider.       By signing my name below, I, [name], attest that this documentation has been prepared under the direction and in the presence of Meredith StaggersJeffrey Patte Winkel, MD. Electronically Signed: [name], Scribe. 12/05/2015 , 3:35 PM .  I personally performed the services described in this documentation, which was scribed in my presence. The recorded information has been reviewed and  considered, and addended by me as needed.

## 2015-12-05 NOTE — Progress Notes (Signed)
   Subjective:    Patient ID: Jerry Johnston, male    DOB: 1976-05-02, 39 y.o.   MRN: [id number]  HPI    Review of Systems  Constitutional: Negative.   HENT: Negative.   Eyes: Negative.   Respiratory: Negative.   Cardiovascular: Negative.   Gastrointestinal: Negative.   Endocrine: Negative.   Genitourinary: Negative.   Musculoskeletal: Negative.   Skin: Negative.   Allergic/Immunologic: Negative.   Neurological: Negative.   Hematological: Negative.   Psychiatric/Behavioral: Negative.        Objective:   Physical Exam        Assessment & Plan:

## 2015-12-06 LAB — GC/CHLAMYDIA PROBE AMP
CT PROBE, AMP APTIMA: NOT DETECTED
GC Probe RNA: NOT DETECTED

## 2015-12-06 LAB — RPR

## 2015-12-06 LAB — HIV ANTIBODY (ROUTINE TESTING W REFLEX): HIV 1&2 Ab, 4th Generation: NONREACTIVE

## 2015-12-14 ENCOUNTER — Other Ambulatory Visit: Payer: Self-pay | Admitting: Family Medicine

## 2016-06-04 ENCOUNTER — Ambulatory Visit: Payer: BLUE CROSS/BLUE SHIELD | Admitting: Family Medicine

## 2016-06-06 ENCOUNTER — Ambulatory Visit: Payer: BLUE CROSS/BLUE SHIELD | Admitting: Family Medicine

## 2016-06-07 ENCOUNTER — Ambulatory Visit: Payer: BLUE CROSS/BLUE SHIELD | Admitting: Family Medicine

## 2016-06-14 ENCOUNTER — Ambulatory Visit (INDEPENDENT_AMBULATORY_CARE_PROVIDER_SITE_OTHER): Payer: Managed Care, Other (non HMO) | Admitting: Family Medicine

## 2016-06-14 ENCOUNTER — Encounter: Payer: Self-pay | Admitting: Family Medicine

## 2016-06-14 VITALS — BP 128/78 | HR 63 | Temp 98.2°F | Resp 16 | Ht 72.0 in | Wt 185.8 lb

## 2016-06-14 DIAGNOSIS — E785 Hyperlipidemia, unspecified: Secondary | ICD-10-CM | POA: Diagnosis not present

## 2016-06-14 DIAGNOSIS — I10 Essential (primary) hypertension: Secondary | ICD-10-CM

## 2016-06-14 LAB — BASIC METABOLIC PANEL
BUN: 10 mg/dL (ref 7–25)
CHLORIDE: 104 mmol/L (ref 98–110)
CO2: 26 mmol/L (ref 20–31)
CREATININE: 1.07 mg/dL (ref 0.60–1.35)
Calcium: 9.4 mg/dL (ref 8.6–10.3)
GLUCOSE: 85 mg/dL (ref 65–99)
Potassium: 4.2 mmol/L (ref 3.5–5.3)
Sodium: 139 mmol/L (ref 135–146)

## 2016-06-14 MED ORDER — LISINOPRIL 20 MG PO TABS: ORAL_TABLET | ORAL | Status: DC

## 2016-06-14 NOTE — Patient Instructions (Addendum)
     IF you received an x-ray today, you will receive an invoice from [facility]. Please contact [facility] at [phone] with questions or concerns regarding your invoice.   IF you received labwork today, you will receive an invoice from [facility]. Please contact [facility] at [phone] with questions or concerns regarding your invoice.   Our billing staff will not be able to assist you with questions regarding bills from these companies.  You will be contacted with the lab results as soon as they are available. The fastest way to get your results is to activate your My Chart account. Instructions are located on the last page of this paperwork. If you have not heard from us regarding the results in 2 weeks, please contact this office.    No change in meds for now. Recheck in 6 months for physical if possible or at least fasting bloodwork at that visit to recheck cholesterol.

## 2016-06-14 NOTE — Progress Notes (Signed)
Subjective:  By signing my name below, I, Jerry Johnston, attest that this documentation has been prepared under the direction and in the presence of Jerry StaggersJeffrey Keiley Levey, MD. Electronically Signed: Stann Oresung-Kai Johnston, Scribe. 06/14/2016 , 4:55 PM .  Patient was seen in Room 21 .   Patient ID: Jerry Johnston, male    DOB: 03-18-1976, 40 y.o.   MRN: [id number] Chief Complaint  Patient presents with  . Follow-up    6 MONTH - blood pressure  . Medication Refill    lisinopril   HPI Jerry Johnston is a 40 y.o. male Here for follow up of HTN. His last meal was around 12:45PM today.   Travel He traveled to [place] and [place] recently. He became sick with diarrhea after "eating the wrong thing". He states that he's doing much better now.   HTN Lab Results  Component Value Date   CREATININE 1.11 12/05/2015   He takes lisinopril 20mg  qd. His BP readings at home range from 119-131 / 58-71. He denies headaches, lightheadedness, cough.   Exercise He is still exercising 4~5 days a week. He denies chest pain or shortness of breath with activity.   HLD Lab Results  Component Value Date   CHOL 220* 12/05/2015   HDL 63 12/05/2015   LDLCALC 145* 12/05/2015   TRIG 61 12/05/2015   CHOLHDL 3.5 12/05/2015   Lab Results  Component Value Date   ALT 26 12/05/2015   AST 30 12/05/2015   ALKPHOS 57 12/05/2015   BILITOT 0.6 12/05/2015   We decided to treat with diet changes only; recheck in 6 months to a year.   Wt Readings from Last 3 Encounters:  06/14/16 185 lb 12.8 oz (84.278 kg)  12/05/15 187 lb 6.4 oz (85.004 kg)  06/13/15 192 lb 3.2 oz (87.181 kg)    Patient Active Problem List   Diagnosis Date Noted  . Drug allergy 03/31/2013   Past Medical History  Diagnosis Date  . Hypertension    Past Surgical History  Procedure Laterality Date  . Wrist surgery     Allergies  Allergen Reactions  . Percocet [Oxycodone-Acetaminophen]     ? Reaction pt stated he had broken out during the  time of taking med.   Prior to Admission medications   Medication Sig Start Date End Date Taking? Authorizing Provider  fish oil-omega-3 fatty acids 1000 MG capsule Take 2 g by mouth daily. Reported on 12/05/2015    Historical Provider, MD  lisinopril (PRINIVIL,ZESTRIL) 20 MG tablet TAKE 1 TABLET (20 MG TOTAL) BY MOUTH DAILY. 12/15/15   Shade FloodJeffrey R Jenniferann Stuckert, MD  Multiple Vitamin (MULTIVITAMIN) tablet Take 1 tablet by mouth daily. Reported on 12/05/2015    Historical Provider, MD   Social History   Social History  . Marital Status: Single    Spouse Name: N/A  . Number of Children: N/A  . Years of Education: N/A   Occupational History  . Psychiatric nurseT Manager    Social History Main Topics  . Smoking status: Never Smoker   . Smokeless tobacco: Not on file  . Alcohol Use: 1.2 - 1.8 oz/week    2-3 Standard drinks or equivalent per week  . Drug Use: No  . Sexual Activity: Yes    Birth Control/ Protection: Condom   Other Topics Concern  . Not on file   Social History Narrative   Single. Education: [employer].   Exercise: Yes   Review of Systems  Constitutional: Negative for fatigue and unexpected weight change.  Eyes: Negative for visual disturbance.  Respiratory: Negative for cough, chest tightness and shortness of breath.   Cardiovascular: Negative for chest pain, palpitations and leg swelling.  Gastrointestinal: Negative for nausea, abdominal pain, diarrhea and blood in stool.  Neurological: Negative for dizziness, light-headedness and headaches.       Objective:   Physical Exam  Constitutional: He is oriented to person, place, and time. He appears well-developed and well-nourished.  HENT:  Head: Normocephalic and atraumatic.  Eyes: EOM are normal. Pupils are equal, round, and reactive to light.  Neck: No JVD present. Carotid bruit is not present.  Cardiovascular: Normal rate, regular rhythm and normal heart sounds.   No murmur heard. Pulmonary/Chest: Effort normal and breath sounds  normal. He has no rales.  Musculoskeletal: He exhibits no edema.  Neurological: He is alert and oriented to person, place, and time.  Skin: Skin is warm and dry.  Psychiatric: He has a normal mood and affect.  Vitals reviewed.   Filed Vitals:   06/14/16 1608  BP: 128/78  Pulse: 63  Temp: 98.2 F (36.8 C)  TempSrc: Oral  Resp: 16  Height: 6' (1.829 m)  Weight: 185 lb 12.8 oz (84.278 kg)  SpO2: 98%      Assessment & Plan:   Jerry Johnston is a 40 y.o. male Essential hypertension - Plan: Basic metabolic panel, lisinopril (PRINIVIL,ZESTRIL) 20 MG tablet  - stable. Tolerating meds without difficulty. BMP pending.   Hyperlipidemia  - continue diet control, recheck fasting labs at physical or in 6 months.   Meds ordered this encounter  Medications  . lisinopril (PRINIVIL,ZESTRIL) 20 MG tablet    Sig: TAKE 1 TABLET (20 MG TOTAL) BY MOUTH DAILY.    Dispense:  90 tablet    Refill:  1   Patient Instructions       IF you received an x-ray today, you will receive an invoice from [facility]. Please contact [facility] at [phone] with questions or concerns regarding your invoice.   IF you received labwork today, you will receive an invoice from [facility]. Please contact [facility] at [phone] with questions or concerns regarding your invoice.   Our billing staff will not be able to assist you with questions regarding bills from these companies.  You will be contacted with the lab results as soon as they are available. The fastest way to get your results is to activate your My Chart account. Instructions are located on the last page of this paperwork. If you have not heard from us regarding the results in 2 weeks, please contact this office.    No change in meds for now. Recheck in 6 months for physical if possible or at least fasting bloodwork at that visit to recheck cholesterol.     I personally performed the services  described in this documentation, which was scribed in my presence. The recorded information has been reviewed and considered, and addended by me as needed.   Signed,   Jerry StaggersJeffrey Glayds Insco, MD [facility].  06/14/2016 5:32 PM

## 2016-08-15 ENCOUNTER — Ambulatory Visit (INDEPENDENT_AMBULATORY_CARE_PROVIDER_SITE_OTHER): Payer: Managed Care, Other (non HMO) | Admitting: Family Medicine

## 2016-08-15 ENCOUNTER — Encounter: Payer: Self-pay | Admitting: Family Medicine

## 2016-08-15 VITALS — BP 160/98 | HR 71 | Temp 97.8°F | Resp 17 | Ht 72.0 in | Wt 192.0 lb

## 2016-08-15 DIAGNOSIS — Z7189 Other specified counseling: Secondary | ICD-10-CM | POA: Diagnosis not present

## 2016-08-15 DIAGNOSIS — Z7184 Encounter for health counseling related to travel: Secondary | ICD-10-CM

## 2016-08-15 DIAGNOSIS — Z202 Contact with and (suspected) exposure to infections with a predominantly sexual mode of transmission: Secondary | ICD-10-CM | POA: Diagnosis not present

## 2016-08-15 MED ORDER — CIPROFLOXACIN HCL 500 MG PO TABS: 500.0000 mg | ORAL_TABLET | Freq: Two times a day (BID) | ORAL | 0 refills | Status: DC

## 2016-08-15 NOTE — Patient Instructions (Addendum)
  I will let you know about the test results, but do not see a reason for treatment at this time. If you do have any new symptoms, return for repeat testing.  I did print the Cipro for you to take on your next trip. Indications for taking an antibiotic for traveler's diarrhea would include more than 4 unformed stools per day, fever, blood or pus in stool, or mucus in stool. If you do not improve within 3 days of taking the medication, recommend evaluation with local health care provider. Enjoy your trip.    IF you received an x-ray today, you will receive an invoice from [facility]. Please contact [facility] at [phone] with questions or concerns regarding your invoice.   IF you received labwork today, you will receive an invoice from [facility]. Please contact [facility] at [phone] with questions or concerns regarding your invoice.   Our billing staff will not be able to assist you with questions regarding bills from these companies.  You will be contacted with the lab results as soon as they are available. The fastest way to get your results is to activate your My Chart account. Instructions are located on the last page of this paperwork. If you have not heard from us regarding the results in 2 weeks, please contact this office.

## 2016-08-15 NOTE — Progress Notes (Signed)
By signing my name below, I, Mesha Guinyard, attest that this documentation has been prepared under the direction and in the presence of Meredith StaggersJeffrey Billy Rocco.  Electronically Signed: Arvilla MarketMesha Guinyard, Medical Scribe. 08/15/16. 5:41 PM.  Subjective:    Patient ID: Jerry ArMarion E Timme, male    DOB: [birth year], 40 y.o.   MRN: [id number]  HPI Chief Complaint  Patient presents with  . Exposure to STD    chlamydia. Girlfriend tested positive. Urine collected.    HPI Comments: Jerry Johnston is a 40 y.o. male who presents to the [facility] complaining of STD exposure. Pt mentions he found a small bump inside his lip. Pt's girlfriend sine June was tested positive for chlamydia 2 days ago. Pt has only been with her sine they began dating. Pt always wears condoms with intercourse, and removes it with a toilet paper. Pt denies genital to genital touching with intercourse, or participation in a open relationship. Pt denies penile discharge, dysuria, genital bumps, rash, abdominal pain, and back pain.  Travel: [place] [place]. Pt would like to get cipro for travelers diarrhea since he had diarrhea for 6 days the last time he traveled.  Patient Active Problem List   Diagnosis Date Noted  . Drug allergy 03/31/2013   Past Medical History:  Diagnosis Date  . Hypertension    Past Surgical History:  Procedure Laterality Date  . WRIST SURGERY     Allergies  Allergen Reactions  . Percocet [Oxycodone-Acetaminophen]     ? Reaction pt stated he had broken out during the time of taking med.   Prior to Admission medications   Medication Sig Start Date End Date Taking? Authorizing Provider  fish oil-omega-3 fatty acids 1000 MG capsule Take 2 g by mouth daily. Reported on 06/14/2016    Historical Provider, MD  lisinopril (PRINIVIL,ZESTRIL) 20 MG tablet TAKE 1 TABLET (20 MG TOTAL) BY MOUTH DAILY. 06/14/16   Shade FloodJeffrey R  Rashmi Tallent, MD  Multiple Vitamin (MULTIVITAMIN) tablet Take 1 tablet by mouth daily. Reported on 06/14/2016    Historical Provider, MD   Social History   Social History  . Marital status: Single    Spouse name: N/A  . Number of children: N/A  . Years of education: N/A   Occupational History  . Psychiatric nurseT Manager    Social History Main Topics  . Smoking status: Never Smoker  . Smokeless tobacco: Not on file  . Alcohol use 1.2 - 1.8 oz/week    2 - 3 Standard drinks or equivalent per week  . Drug use: No  . Sexual activity: Yes    Birth control/ protection: Condom   Other Topics Concern  . Not on file   Social History Narrative   Single. Education: [employer].   Exercise: Yes   Depression screen [facility] 2/9 08/15/2016 06/14/2016 12/05/2015 06/13/2015 05/31/2014  Decreased Interest 0 0 0 0 0  Down, Depressed, Hopeless 0 0 0 0 0  PHQ - 2 Score 0 0 0 0 0   Review of Systems  Gastrointestinal: Negative for abdominal pain.  Genitourinary: Negative for discharge, dysuria and genital sores.  Musculoskeletal: Negative for back pain.  Skin: Negative for rash.   Objective:  Physical Exam  Constitutional: He appears well-developed and well-nourished. No distress.  HENT:  Head: Normocephalic and atraumatic.  Eyes: Conjunctivae are normal.  Neck: Neck supple.  Cardiovascular: Normal rate, regular rhythm and normal  heart sounds.  Exam reveals no friction rub.   No murmur heard. Pulmonary/Chest: Breath sounds normal. No respiratory distress. He has no wheezes. He has no rales.  Abdominal: Soft. There is no tenderness. There is no rebound and no guarding.  Neurological: He is alert.  Skin: Skin is warm and dry.  Psychiatric: He has a normal mood and affect. His behavior is normal.  Nursing note and vitals reviewed.  BP (!) 160/98 (BP Location: Right Arm, Patient Position: Sitting, Cuff Size: Large)   Pulse 71   Temp 97.8 F (36.6 C) (Oral)   Resp 17   Ht 6' (1.829 m)   Wt 192 lb (87.1 kg)   SpO2  100%   BMI 26.04 kg/m  Assessment & Plan:   Jerry Johnston is a 40 y.o. male Possible exposure to STD - Plan: GC/Chlamydia Probe Amp, HIV antibody, RPR  - Asymptomatic, so deferred treatment at this time. We'll test for HIV, syphilis, Chlamydia, and gonorrhea. Continue safer sex practices.   Counseling about travel  -He has researched appropriate vaccines through [employer]. Cipro 500 mg twice a day for 3 days if needed for infectious diarrhea, and indications for use were discussed on after visit summary.  Meds ordered this encounter  Medications  . ciprofloxacin (CIPRO) 500 MG tablet    Sig: Take 1 tablet (500 mg total) by mouth 2 (two) times daily.    Dispense:  6 tablet    Refill:  0   Patient Instructions    I will let you know about the test results, but do not see a reason for treatment at this time. If you do have any new symptoms, return for repeat testing.  I did print the Cipro for you to take on your next trip. Indications for taking an antibiotic for traveler's diarrhea would include more than 4 unformed stools per day, fever, blood or pus in stool, or mucus in stool. If you do not improve within 3 days of taking the medication, recommend evaluation with local health care provider. Enjoy your trip.    IF you received an x-ray today, you will receive an invoice from [facility]. Please contact [facility] at [phone] with questions or concerns regarding your invoice.   IF you received labwork today, you will receive an invoice from [employer]. Please contact [facility] at [phone] with questions or concerns regarding your invoice.   Our billing staff will not be able to assist you with questions regarding bills from these companies.  You will be contacted with the lab results as soon as they are available. The fastest way to get your results is to activate your My Chart account. Instructions are located on the last page of  this paperwork. If you have not heard from [place] regarding the results in 2 weeks, please contact this office.

## 2016-08-16 LAB — GC/CHLAMYDIA PROBE AMP
CT PROBE, AMP APTIMA: NOT DETECTED
GC PROBE AMP APTIMA: NOT DETECTED

## 2016-08-16 LAB — HIV ANTIBODY (ROUTINE TESTING W REFLEX): HIV 1&2 Ab, 4th Generation: NONREACTIVE

## 2016-08-17 LAB — RPR

## 2016-12-06 ENCOUNTER — Ambulatory Visit (INDEPENDENT_AMBULATORY_CARE_PROVIDER_SITE_OTHER): Payer: Managed Care, Other (non HMO) | Admitting: Family Medicine

## 2016-12-06 ENCOUNTER — Encounter: Payer: Self-pay | Admitting: Family Medicine

## 2016-12-06 VITALS — BP 126/64 | HR 63 | Temp 97.7°F | Ht 72.0 in | Wt 191.4 lb

## 2016-12-06 DIAGNOSIS — I10 Essential (primary) hypertension: Secondary | ICD-10-CM

## 2016-12-06 DIAGNOSIS — E785 Hyperlipidemia, unspecified: Secondary | ICD-10-CM

## 2016-12-06 DIAGNOSIS — Z0000 Encounter for general adult medical examination without abnormal findings: Secondary | ICD-10-CM

## 2016-12-06 DIAGNOSIS — Z23 Encounter for immunization: Secondary | ICD-10-CM | POA: Diagnosis not present

## 2016-12-06 MED ORDER — LISINOPRIL 20 MG PO TABS: ORAL_TABLET | ORAL | 3 refills | Status: DC

## 2016-12-06 NOTE — Progress Notes (Signed)
By signing my name below, I, Jerry Johnston, attest that this documentation has been prepared under the direction and in the presence of Jerry StaggersJeffrey Si Jachim, MD.  Electronically Signed: Arvilla MarketMesha Johnston, Medical Scribe. 12/06/16. 10:13 AM.  Subjective:    Patient ID: Jerry Johnston, male    DOB: December 11, 1976, 40 y.o.   MRN: [id number]  HPI Chief Complaint  Patient presents with  . Annual Exam    CPE    HPI Comments: Jerry Johnston is a 40 y.o. male who presents to the [facility] for a complete physical.  Prostate Cancer Screening: Grandfather recently dx with prostate CA, and 38 y/o dad has recently been dx with benign enlarged prostate.  HTN: He is well managed with lisinopril 20 mg QD. Compliant with lisinopril and he does not check his bp regularly. Denies experiencing any new side effects such as light-headed, dizziness, and other acute sxs. Lab Results  Component Value Date   CREATININE 1.07 06/14/2016   BP Readings from Last 3 Encounters:  12/06/16 126/64  08/15/16 (!) 160/98  06/14/16 128/78   HLD: Mildly elevated LDL during his last physical 11/2015. Discussed diet and exercise with repeat testing. Lab Results  Component Value Date   CHOL 220 (H) 12/05/2015   HDL 63 12/05/2015   LDLCALC 145 (H) 12/05/2015   TRIG 61 12/05/2015   CHOLHDL 3.5 12/05/2015   STI Screening: Preformed 07/2016, all nl. He has not had any new STI exposure since last screening.  Immunizations: Immunization History  Administered Date(s) Administered  . Hepatitis A 03/28/2007, 09/30/2007  . Hepatitis B 03/28/2007, 04/28/2007, 09/30/2007, 05/31/2014  . Hepatitis B, adult 05/31/2014  . Influenza Split 09/30/2013, 09/30/2014  . Influenza,inj,Quad PF,36+ Mos 12/05/2015, 12/06/2016  . Tdap 03/28/2007   Vision:  Visual Acuity Screening   Right eye Left eye Both eyes  Without correction: 20/15 20/13 20/13   With correction:      Dentist: Followed and has biannual  appts.  Exercise: 4-5x a week and denies experiencing any chronic sxs with exercise.  Right Wrist: Reports it's back to 100%.  Depression Screening: Depression screen [facility] 2/9 12/06/2016 08/15/2016 06/14/2016 12/05/2015 06/13/2015  Decreased Interest 0 0 0 0 0  Down, Depressed, Hopeless 0 0 0 0 0  PHQ - 2 Score 0 0 0 0 0    Patient Active Problem List   Diagnosis Date Noted  . Drug allergy 03/31/2013   Past Medical History:  Diagnosis Date  . Hypertension    Past Surgical History:  Procedure Laterality Date  . WRIST SURGERY     Allergies  Allergen Reactions  . Percocet [Oxycodone-Acetaminophen]     ? Reaction pt stated he had broken out during the time of taking med.   Prior to Admission medications   Medication Sig Start Date End Date Taking? Authorizing Provider  ciprofloxacin (CIPRO) 500 MG tablet Take 1 tablet (500 mg total) by mouth 2 (two) times daily. 08/15/16   Jerry FloodJeffrey R Yony Roulston, MD  lisinopril (PRINIVIL,ZESTRIL) 20 MG tablet TAKE 1 TABLET (20 MG TOTAL) BY MOUTH DAILY. 06/14/16   Jerry FloodJeffrey R Lucion Dilger, MD   Social History   Social History  . Marital status: Single    Spouse name: N/A  . Number of children: N/A  . Years of education: N/A   Occupational History  . Psychiatric nurseT Manager    Social History Main Topics  . Smoking status: Never Smoker  . Smokeless tobacco: Never Used  . Alcohol use 1.2 - 1.8  oz/week    2 - 3 Standard drinks or equivalent per week  . Drug use: No  . Sexual activity: Yes    Birth control/ protection: Condom   Other Topics Concern  . Not on file   Social History Narrative   Single. Education: [employer].   Exercise: Yes   Review of Systems  13 point ROS was negative. Objective:  Physical Exam  Constitutional: He is oriented to person, place, and time. He appears well-developed and well-nourished.  HENT:  Head: Normocephalic and atraumatic.  Right Ear: External ear normal.  Left Ear: External ear normal.  Mouth/Throat: Oropharynx is clear  and moist.  Eyes: Conjunctivae and EOM are normal. Pupils are equal, round, and reactive to light.  Neck: Normal range of motion. Neck supple. No thyromegaly present.  Cardiovascular: Normal rate, regular rhythm, normal heart sounds and intact distal pulses.   Pulmonary/Chest: Effort normal and breath sounds normal. No respiratory distress. He has no wheezes.  Abdominal: Soft. He exhibits no distension. There is no tenderness.  Musculoskeletal: Normal range of motion. He exhibits no edema or tenderness.  Lymphadenopathy:    He has no cervical adenopathy.  Neurological: He is alert and oriented to person, place, and time. He has normal reflexes.  Skin: Skin is warm and dry.  Psychiatric: He has a normal mood and affect. His behavior is normal.  Vitals reviewed.  BP 126/64 (BP Location: Right Arm, Patient Position: Sitting, Cuff Size: Small)   Pulse 63   Temp 97.7 F (36.5 C) (Oral)   Ht 6' (1.829 m)   Wt 191 lb 6.4 oz (86.8 kg)   SpO2 100%   BMI 25.96 kg/m    Assessment & Plan:   Jerry Johnston is a 40 y.o. male Annual physical exam  - -anticipatory guidance as below in AVS, screening labs above. Health maintenance items as above in HPI discussed/recommended as applicable.   Need for prophylactic vaccination and inoculation against influenza - Plan: Flu Vaccine QUAD 36+ mos IM  Essential hypertension - Plan: lisinopril (PRINIVIL,ZESTRIL) 20 MG tablet, Comprehensive metabolic panel  - Stable, no med changes, labs pending.  Hyperlipidemia, unspecified hyperlipidemia type - Plan: Comprehensive metabolic panel, Lipid panel  - repeat lipid panel, then can look at 10 year cardiovascular risk, but under age 80. Diet, exercise discussed, consider medications, but will look at recent lipid panel first.  Meds ordered this encounter  Medications  . lisinopril (PRINIVIL,ZESTRIL) 20 MG tablet    Sig: TAKE 1 TABLET (20 MG TOTAL) BY MOUTH DAILY.    Dispense:  90 tablet    Refill:  3    Patient Instructions     No change in medications for now. I'll check your cholesterol, then can decide on medication if needed, but ideally work on diet, exercise initially. Let me know if you have any questions from today's visit otherwise follow-up in 6-9 months for blood pressure and cholesterol.   Keeping you healthy  Get these tests  Blood pressure- Have your blood pressure checked once a year by your healthcare provider.  Normal blood pressure is 120/80.  Weight- Have your body mass index (BMI) calculated to screen for obesity.  BMI is a measure of body fat based on height and weight. You can also calculate your own BMI at https://www.west-esparza.com/.  Cholesterol- Have your cholesterol checked regularly starting at age 51, sooner may be necessary if you have diabetes, high blood pressure, if a family member developed heart diseases at an early age  or if you smoke.   Chlamydia, HIV, and other sexual transmitted disease- Get screened each year until the age of 38 then within three months of each new sexual partner.  Diabetes- Have your blood sugar checked regularly if you have high blood pressure, high cholesterol, a family history of diabetes or if you are overweight.  Get these vaccines  Flu shot- Every fall.  Tetanus shot- Every 10 years.  Menactra- Single dose; prevents meningitis.  Take these steps  Don't smoke- If you do smoke, ask your healthcare provider about quitting. For tips on how to quit, go to www.smokefree.gov or call 1-800-QUIT-NOW.  Be physically active- Exercise 5 days a week for at least 30 minutes.  If you are not already physically active start slow and gradually work up to 30 minutes of moderate physical activity.  Examples of moderate activity include walking briskly, mowing the yard, dancing, swimming bicycling, etc.  Eat a healthy diet- Eat a variety of healthy foods such as fruits, vegetables, low fat milk, low fat cheese, yogurt, lean meats,  poultry, fish, beans, tofu, etc.  For more information on healthy eating, go to www.thenutritionsource.org  Drink alcohol in moderation- Limit alcohol intake two drinks or less a day.  Never drink and drive.  Dentist- Brush and floss teeth twice daily; visit your dentis twice a year.  Depression-Your emotional health is as important as your physical health.  If you're feeling down, losing interest in things you normally enjoy please talk with your healthcare provider.  Gun Safety- If you keep a gun in your home, keep it unloaded and with the safety lock on.  Bullets should be stored separately.  Helmet use- Always wear a helmet when riding a motorcycle, bicycle, rollerblading or skateboarding.  Safe sex- If you may be exposed to a sexually transmitted infection, use a condom  Seat belts- Seat bels can save your life; always wear one.  Smoke/Carbon Monoxide detectors- These detectors need to be installed on the appropriate level of your home.  Replace batteries at least once a year.  Skin Cancer- When out in the sun, cover up and use sunscreen SPF 15 or higher.  Violence- If anyone is threatening or hurting you, please tell your healthcare provider.     IF you received an x-ray today, you will receive an invoice from [facility]. Please contact [facility] at [phone] with questions or concerns regarding your invoice.   IF you received labwork today, you will receive an invoice from [facility]. Please contact [facility] at [phone] with questions or concerns regarding your invoice.   Our billing staff will not be able to assist you with questions regarding bills from these companies.  You will be contacted with the lab results as soon as they are available. The fastest way to get your results is to activate your My Chart account. Instructions are located on the last page of this paperwork. If you have not heard from us regarding the results in 2 weeks, please  contact this office.        I personally performed the services described in this documentation, which was scribed in my presence. The recorded information has been reviewed and considered, and addended by me as needed.   Signed,   Jerry StaggersJeffrey Illa Enlow, MD [facility].  12/09/16 12:18 AM

## 2016-12-06 NOTE — Patient Instructions (Signed)
No change in medications for now. I'll check your cholesterol, then can decide on medication if needed, but ideally work on diet, exercise initially. Let me know if you have any questions from today's visit otherwise follow-up in 6-9 months for blood pressure and cholesterol.   Keeping you healthy  Get these tests  Blood pressure- Have your blood pressure checked once a year by your healthcare provider.  Normal blood pressure is 120/80.  Weight- Have your body mass index (BMI) calculated to screen for obesity.  BMI is a measure of body fat based on height and weight. You can also calculate your own BMI at https://www.west-esparza.com/www.nhlbisupport.com/bmi/.  Cholesterol- Have your cholesterol checked regularly starting at age 38, sooner may be necessary if you have diabetes, high blood pressure, if a family member developed heart diseases at an early age or if you smoke.   Chlamydia, HIV, and other sexual transmitted disease- Get screened each year until the age of 38 then within three months of each new sexual partner.  Diabetes- Have your blood sugar checked regularly if you have high blood pressure, high cholesterol, a family history of diabetes or if you are overweight.  Get these vaccines  Flu shot- Every fall.  Tetanus shot- Every 10 years.  Menactra- Single dose; prevents meningitis.  Take these steps  Don't smoke- If you do smoke, ask your healthcare provider about quitting. For tips on how to quit, go to www.smokefree.gov or call 1-800-QUIT-NOW.  Be physically active- Exercise 5 days a week for at least 30 minutes.  If you are not already physically active start slow and gradually work up to 30 minutes of moderate physical activity.  Examples of moderate activity include walking briskly, mowing the yard, dancing, swimming bicycling, etc.  Eat a healthy diet- Eat a variety of healthy foods such as fruits, vegetables, low fat milk, low fat cheese, yogurt, lean meats, poultry, fish, beans, tofu, etc.   For more information on healthy eating, go to www.thenutritionsource.org  Drink alcohol in moderation- Limit alcohol intake two drinks or less a day.  Never drink and drive.  Dentist- Brush and floss teeth twice daily; visit your dentis twice a year.  Depression-Your emotional health is as important as your physical health.  If you're feeling down, losing interest in things you normally enjoy please talk with your healthcare provider.  Gun Safety- If you keep a gun in your home, keep it unloaded and with the safety lock on.  Bullets should be stored separately.  Helmet use- Always wear a helmet when riding a motorcycle, bicycle, rollerblading or skateboarding.  Safe sex- If you may be exposed to a sexually transmitted infection, use a condom  Seat belts- Seat bels can save your life; always wear one.  Smoke/Carbon Monoxide detectors- These detectors need to be installed on the appropriate level of your home.  Replace batteries at least once a year.  Skin Cancer- When out in the sun, cover up and use sunscreen SPF 15 or higher.  Violence- If anyone is threatening or hurting you, please tell your healthcare provider.     IF you received an x-ray today, you will receive an invoice from [facility]. Please contact [facility] at [phone] with questions or concerns regarding your invoice.   IF you received labwork today, you will receive an invoice from [facility]. Please contact [facility] at [phone] with questions or concerns regarding your invoice.   Our billing staff will not be able to assist you with questions regarding  bills from these companies.  You will be contacted with the lab results as soon as they are available. The fastest way to get your results is to activate your My Chart account. Instructions are located on the last page of this paperwork. If you have not heard from [place] regarding the results in 2 weeks, please contact this office.

## 2016-12-07 LAB — COMPREHENSIVE METABOLIC PANEL
A/G RATIO: 1.5 (ref 1.2–2.2)
ALT: 33 IU/L (ref 0–44)
AST: 33 IU/L (ref 0–40)
Albumin: 4.7 g/dL (ref 3.5–5.5)
Alkaline Phosphatase: 74 IU/L (ref 39–117)
BILIRUBIN TOTAL: 0.4 mg/dL (ref 0.0–1.2)
BUN / CREAT RATIO: 10 (ref 9–20)
BUN: 12 mg/dL (ref 6–20)
CALCIUM: 10.1 mg/dL (ref 8.7–10.2)
CHLORIDE: 98 mmol/L (ref 96–106)
CO2: 25 mmol/L (ref 18–29)
Creatinine, Ser: 1.25 mg/dL (ref 0.76–1.27)
GFR, EST AFRICAN AMERICAN: 84 mL/min/{1.73_m2} (ref >59)
GFR, EST NON AFRICAN AMERICAN: 73 mL/min/{1.73_m2} (ref >59)
GLOBULIN, TOTAL: 3.1 g/dL (ref 1.5–4.5)
Glucose: 80 mg/dL (ref 65–99)
POTASSIUM: 5.1 mmol/L (ref 3.5–5.2)
SODIUM: 140 mmol/L (ref 134–144)
TOTAL PROTEIN: 7.8 g/dL (ref 6.0–8.5)

## 2016-12-07 LAB — LIPID PANEL
CHOL/HDL RATIO: 3.6 ratio (ref 0.0–5.0)
Cholesterol, Total: 229 mg/dL — ABNORMAL HIGH (ref 100–199)
HDL: 63 mg/dL (ref >39)
LDL Calculated: 150 mg/dL — ABNORMAL HIGH (ref 0–99)
Triglycerides: 78 mg/dL (ref 0–149)
VLDL Cholesterol Cal: 16 mg/dL (ref 5–40)

## 2017-06-06 ENCOUNTER — Encounter: Payer: Self-pay | Admitting: Family Medicine

## 2017-06-06 ENCOUNTER — Ambulatory Visit (INDEPENDENT_AMBULATORY_CARE_PROVIDER_SITE_OTHER): Payer: Managed Care, Other (non HMO) | Admitting: Family Medicine

## 2017-06-06 VITALS — BP 133/79 | HR 68 | Temp 97.9°F | Resp 18 | Ht 72.44 in | Wt 194.4 lb

## 2017-06-06 DIAGNOSIS — E785 Hyperlipidemia, unspecified: Secondary | ICD-10-CM | POA: Diagnosis not present

## 2017-06-06 DIAGNOSIS — I10 Essential (primary) hypertension: Secondary | ICD-10-CM | POA: Diagnosis not present

## 2017-06-06 DIAGNOSIS — Z23 Encounter for immunization: Secondary | ICD-10-CM | POA: Diagnosis not present

## 2017-06-06 MED ORDER — LISINOPRIL 20 MG PO TABS: ORAL_TABLET | ORAL | 3 refills | Status: DC

## 2017-06-06 NOTE — Progress Notes (Signed)
Subjective:    Patient ID: Jerry Johnston, male    DOB: July 08, 1976, 41 y.o.   MRN: [id number]  HPI Jerry Johnston is a 41 y.o. male  Here for follow-up.  Father passed away a few weeks ago.  He had stroke in 2010, then prostate cancer. Recently diagnosed with stomach cancer and then given a year to live in April.  Steep decline since that time. Was able to say goodbye and some closure.  Does not feel depressed currently.   Hypertension:  takes lisinopril 20 mg daily. Lab Results  Component Value Date   CREATININE 1.25 12/06/2016  recently started exercising again, but not as much lately with passing of father.   Hyperlipidemia: Elevated previously, but based on age decided against statin medication.  Has not been able to exercise or follow diet as much as in past due to recent loss as above.  Wt Readings from Last 3 Encounters:  06/06/17 194 lb 6.4 oz (88.2 kg)  12/06/16 191 lb 6.4 oz (86.8 kg)  08/15/16 192 lb (87.1 kg)   Lab Results  Component Value Date   CHOL 229 (H) 12/06/2016   HDL 63 12/06/2016   LDLCALC 150 (H) 12/06/2016   TRIG 78 12/06/2016   CHOLHDL 3.6 12/06/2016   Lab Results  Component Value Date   ALT 33 12/06/2016   AST 33 12/06/2016   ALKPHOS 74 12/06/2016   BILITOT 0.4 12/06/2016      Patient Active Problem List   Diagnosis Date Noted  . Drug allergy 03/31/2013   Past Medical History:  Diagnosis Date  . Hypertension    Past Surgical History:  Procedure Laterality Date  . WRIST SURGERY     Allergies  Allergen Reactions  . Percocet [Oxycodone-Acetaminophen]     ? Reaction pt stated he had broken out during the time of taking med.   Prior to Admission medications   Medication Sig Start Date End Date Taking? Authorizing Provider  lisinopril (PRINIVIL,ZESTRIL) 20 MG tablet TAKE 1 TABLET (20 MG TOTAL) BY MOUTH DAILY. 12/06/16  Yes [name], MD   Social History   Social History  . Marital status: Single    Spouse name: N/A   . Number of children: N/A  . Years of education: N/A   Occupational History  . Psychiatric nurse    Social History Main Topics  . Smoking status: Never Smoker  . Smokeless tobacco: Never Used  . Alcohol use 1.2 - 1.8 oz/week    2 - 3 Standard drinks or equivalent per week  . Drug use: No  . Sexual activity: Yes    Birth control/ protection: Condom   Other Topics Concern  . Not on file   Social History Narrative   Single. Education: [employer].   Exercise: Yes      Review of Systems  Constitutional: Negative for fatigue and unexpected weight change.  Eyes: Negative for visual disturbance.  Respiratory: Negative for cough, chest tightness and shortness of breath.   Cardiovascular: Negative for chest pain, palpitations and leg swelling.  Gastrointestinal: Negative for abdominal pain and blood in stool.  Neurological: Negative for dizziness, light-headedness and headaches.       Objective:   Physical Exam  Constitutional: He is oriented to person, place, and time. He appears well-developed and well-nourished.  HENT:  Head: Normocephalic and atraumatic.  Eyes: EOM are normal. Pupils are equal, round, and reactive to light.  Neck: No JVD present. Carotid bruit is not present.  Cardiovascular: Normal rate, regular rhythm and normal heart sounds.   No murmur heard. Pulmonary/Chest: Effort normal and breath sounds normal. He has no rales.  Musculoskeletal: He exhibits no edema.  Neurological: He is alert and oriented to person, place, and time.  Skin: Skin is warm and dry.  Psychiatric: He has a normal mood and affect.  Vitals reviewed.  Vitals:   06/06/17 0800  BP: 133/79  Pulse: 68  Resp: 18  Temp: 97.9 F (36.6 C)  TempSrc: Oral  SpO2: 98%  Weight: 194 lb 6.4 oz (88.2 kg)  Height: 6' 0.44" (1.84 m)      Assessment & Plan:   Jerry Johnston is a 41 y.o. male Hyperlipidemia, unspecified hyperlipidemia type - Plan: Comprehensive metabolic panel, Lipid panel  -  Recheck lab work, but recent decreased exercise and change in diet with loss of father. Previously his 10 year ASCVD risk was less than level for statin recommendation, especially at his age. Repeat labs.  Essential hypertension - Plan: Comprehensive metabolic panel, lisinopril (PRINIVIL,ZESTRIL) 20 MG tablet, Care order/instruction:  - Check labs, tolerating lisinopril at current dose. Continue same dose, follow up in approximately 6 months for physical.  Advised if he needed assistance with loss of his father, to let me know. Additionally from a health maintenance standpoint would consider prostate cancer testing sooner, possibly at age 41.  tdap updated.   Meds ordered this encounter  Medications  . lisinopril (PRINIVIL,ZESTRIL) 20 MG tablet    Sig: TAKE 1 TABLET (20 MG TOTAL) BY MOUTH DAILY.    Dispense:  90 tablet    Refill:  3   Patient Instructions       IF you received an x-ray today, you will receive an invoice from [facility]. Please contact [facility] at [phone] with questions or concerns regarding your invoice.   IF you received labwork today, you will receive an invoice from [facility]. Please contact [facility] at [phone] with questions or concerns regarding your invoice.   Our billing staff will not be able to assist you with questions regarding bills from these companies.  You will be contacted with the lab results as soon as they are available. The fastest way to get your results is to activate your My Chart account. Instructions are located on the last page of this paperwork. If you have not heard from us regarding the results in 2 weeks, please contact this office.       Signed,   Meredith StaggersJeffrey Adreanne Yono, MD Primary Care at [facility].  06/06/17 8:26 AM

## 2017-06-06 NOTE — Patient Instructions (Signed)
     IF you received an x-ray today, you will receive an invoice from [facility]. Please contact  Radiology at [phone] with questions or concerns regarding your invoice.   IF you received labwork today, you will receive an invoice from [facility]. Please contact [facility] at [phone] with questions or concerns regarding your invoice.   Our billing staff will not be able to assist you with questions regarding bills from these companies.  You will be contacted with the lab results as soon as they are available. The fastest way to get your results is to activate your My Chart account. Instructions are located on the last page of this paperwork. If you have not heard from us regarding the results in 2 weeks, please contact this office.     

## 2017-06-07 LAB — COMPREHENSIVE METABOLIC PANEL
ALBUMIN: 4.7 g/dL (ref 3.5–5.5)
ALK PHOS: 73 IU/L (ref 39–117)
ALT: 29 IU/L (ref 0–44)
AST: 28 IU/L (ref 0–40)
Albumin/Globulin Ratio: 1.6 (ref 1.2–2.2)
BUN / CREAT RATIO: 13 (ref 9–20)
BUN: 18 mg/dL (ref 6–20)
Bilirubin Total: 0.4 mg/dL (ref 0.0–1.2)
CALCIUM: 9.7 mg/dL (ref 8.7–10.2)
CO2: 21 mmol/L (ref 20–29)
CREATININE: 1.37 mg/dL — AB (ref 0.76–1.27)
Chloride: 101 mmol/L (ref 96–106)
GFR calc Af Amer: 75 mL/min/{1.73_m2} (ref >59)
GFR calc non Af Amer: 65 mL/min/{1.73_m2} (ref >59)
Globulin, Total: 2.9 g/dL (ref 1.5–4.5)
Glucose: 84 mg/dL (ref 65–99)
Potassium: 5.2 mmol/L (ref 3.5–5.2)
Sodium: 138 mmol/L (ref 134–144)
Total Protein: 7.6 g/dL (ref 6.0–8.5)

## 2017-06-07 LAB — LIPID PANEL
Chol/HDL Ratio: 3.8 ratio (ref 0.0–5.0)
Cholesterol, Total: 204 mg/dL — ABNORMAL HIGH (ref 100–199)
HDL: 54 mg/dL (ref >39)
LDL Calculated: 137 mg/dL — ABNORMAL HIGH (ref 0–99)
Triglycerides: 65 mg/dL (ref 0–149)
VLDL Cholesterol Cal: 13 mg/dL (ref 5–40)

## 2017-06-17 ENCOUNTER — Other Ambulatory Visit: Payer: Self-pay | Admitting: Family Medicine

## 2017-06-17 DIAGNOSIS — R7989 Other specified abnormal findings of blood chemistry: Secondary | ICD-10-CM

## 2017-06-17 NOTE — Progress Notes (Signed)
Elevated creatinine on most recent blood work. Lab only visit planned

## 2017-12-19 ENCOUNTER — Ambulatory Visit (INDEPENDENT_AMBULATORY_CARE_PROVIDER_SITE_OTHER): Payer: Managed Care, Other (non HMO) | Admitting: Family Medicine

## 2017-12-19 ENCOUNTER — Encounter: Payer: Self-pay | Admitting: Family Medicine

## 2017-12-19 ENCOUNTER — Other Ambulatory Visit: Payer: Self-pay

## 2017-12-19 VITALS — BP 138/78 | HR 73 | Temp 98.1°F | Resp 16 | Ht 72.0 in | Wt 193.0 lb

## 2017-12-19 DIAGNOSIS — I10 Essential (primary) hypertension: Secondary | ICD-10-CM

## 2017-12-19 DIAGNOSIS — E785 Hyperlipidemia, unspecified: Secondary | ICD-10-CM

## 2017-12-19 DIAGNOSIS — Z113 Encounter for screening for infections with a predominantly sexual mode of transmission: Secondary | ICD-10-CM | POA: Diagnosis not present

## 2017-12-19 DIAGNOSIS — Z0000 Encounter for general adult medical examination without abnormal findings: Secondary | ICD-10-CM

## 2017-12-19 MED ORDER — LISINOPRIL 20 MG PO TABS: ORAL_TABLET | ORAL | 3 refills | Status: DC

## 2017-12-19 NOTE — Progress Notes (Signed)
Subjective:  By signing my name below, I, Jerry Johnston, attest that this documentation has been prepared under the direction and in the presence of [name], MD. Electronically Signed: Stann Johnston, Scribe. 12/19/2017 , 8:34 AM .  Patient was seen in Room 10 .   Patient ID: Jerry Johnston, male    DOB: 03-Oct-1976, 42 y.o.   MRN: [id number] Chief Complaint  Patient presents with  . Annual Exam   HPI Jerry Johnston is a 42 y.o. male Here for annual physical. He has a history of HTN and borderline hyperlipidemia. He has family history of prostate cancer in his father, who passed away last year. He is fasting today.   He recently returned from traveling to [place] ([place] and [place]) on Dec 18th.   HTN He takes lisinopril 20mg  QD.   Lab Results  Component Value Date   CREATININE 1.37 (H) 06/06/2017    Borderline hyperlipidemia Lab Results  Component Value Date   CHOL 204 (H) 06/06/2017   HDL 54 06/06/2017   LDLCALC 137 (H) 06/06/2017   TRIG 65 06/06/2017   CHOLHDL 3.8 06/06/2017    Cancer Screening Prostate cancer: family history: father, who passed away last year. Plan to start prostate testing at age 27.  Family history:  Maternal grandmother: some kind of cancer, unsure Paternal grandfather: prostate cancer  Immunizations Immunization History  Administered Date(s) Administered  . Hepatitis A 03/28/2007, 09/30/2007  . Hepatitis B 03/28/2007, 04/28/2007, 09/30/2007, 05/31/2014  . Hepatitis B, adult 05/31/2014  . Influenza Split 09/30/2013, 09/30/2014  . Influenza,inj,Quad PF,6+ Mos 12/05/2015, 12/06/2016  . Tdap 03/28/2007, 06/06/2017   Reported to have received flu vaccine on Sept 17th, 2018 with employer.   Depression Depression screen [facility] 2/9 12/19/2017 06/06/2017 12/06/2016 08/15/2016 06/14/2016  Decreased Interest 0 0 0 0 0  Down, Depressed, Hopeless 0 0 0 0 0  PHQ - 2 Score 0 0 0 0 0    Vision  Visual Acuity Screening   Right eye Left eye Both  eyes  Without correction: 20/15 20/15 20/13   With correction:      Last saw eye doctor in June 2018.   Dentist He sees his dentist every 6 months.   Exercise He reports about 4-5 days of exercise a week.   Home Exercise  12/19/2017  Current Exercise Habits Structured exercise class  Type of exercise strength training/weights;treadmill  Time (Minutes) 60  Frequency (Times/Week) 4  Weekly Exercise (Minutes/Week) 240  Intensity Moderate  Exercise limited by: None identified    STI testing He agrees to STI testing today. He's been with a new partner since last STI testing. He reports condoms every time.   Patient Active Problem List   Diagnosis Date Noted  . Drug allergy 03/31/2013   Past Medical History:  Diagnosis Date  . Hypertension    Past Surgical History:  Procedure Laterality Date  . WRIST SURGERY     Allergies  Allergen Reactions  . Percocet [Oxycodone-Acetaminophen]     ? Reaction pt stated he had broken out during the time of taking med.   Prior to Admission medications   Medication Sig Start Date End Date Taking? Authorizing Provider  lisinopril (PRINIVIL,ZESTRIL) 20 MG tablet TAKE 1 TABLET (20 MG TOTAL) BY MOUTH DAILY. 06/06/17   [name], MD   Social History   Socioeconomic History  . Marital status: Single    Spouse name: Not on file  . Number of children: Not on file  . Years  of education: Not on file  . Highest education level: Not on file  Social Needs  . Financial resource strain: Not on file  . Food insecurity - worry: Not on file  . Food insecurity - inability: Not on file  . Transportation needs - medical: Not on file  . Transportation needs - non-medical: Not on file  Occupational History  . Occupation: Psychiatric nurse  Tobacco Use  . Smoking status: Never Smoker  . Smokeless tobacco: Never Used  Substance and Sexual Activity  . Alcohol use: Yes    Alcohol/week: 1.2 - 1.8 oz    Types: 2 - 3 Standard drinks or equivalent per week    . Drug use: No  . Sexual activity: Yes    Birth control/protection: Condom  Other Topics Concern  . Not on file  Social History Narrative   Single. Education: [employer].   Exercise: Yes   Review of Systems 13 point ROS - negative     Objective:   Physical Exam  Constitutional: He is oriented to person, place, and time. He appears well-developed and well-nourished.  HENT:  Head: Normocephalic and atraumatic.  Right Ear: External ear normal.  Left Ear: External ear normal.  Mouth/Throat: Oropharynx is clear and moist.  Eyes: Conjunctivae and EOM are normal. Pupils are equal, round, and reactive to light.  Neck: Normal range of motion. Neck supple. No thyromegaly present.  Cardiovascular: Normal rate, regular rhythm, normal heart sounds and intact distal pulses.  Pulmonary/Chest: Effort normal and breath sounds normal. No respiratory distress. He has no wheezes.  Abdominal: Soft. He exhibits no distension. There is no tenderness.  Musculoskeletal: Normal range of motion. He exhibits no edema or tenderness.  Lymphadenopathy:    He has no cervical adenopathy.  Neurological: He is alert and oriented to person, place, and time. He has normal reflexes.  Skin: Skin is warm and dry.  Psychiatric: He has a normal mood and affect. His behavior is normal.  Vitals reviewed.  Vitals:   12/19/17 0812  BP: 138/78  Pulse: 73  Resp: 16  Temp: 98.1 F (36.7 C)  TempSrc: Oral  SpO2: 98%  Weight: 193 lb (87.5 kg)  Height: 6' (1.829 m)      Assessment & Plan:   [name] is a 42 y.o. male Annual physical exam  --anticipatory guidance as below in AVS, screening labs above. Health maintenance items as above in HPI discussed/recommended as applicable. With family history of prostate cancer, likely will start testing next year - age 53  Essential hypertension - Plan: Comprehensive metabolic panel, lisinopril (PRINIVIL,ZESTRIL) 20 MG tablet  - Stable, continue same dose lisinopril.  Labs pending.  Routine screening for STI (sexually transmitted infection) - Plan: HIV antibody, RPR, CANCELED: C. trachomatis/N. gonorrhoeae RNA  Hyperlipidemia, unspecified hyperlipidemia type - Plan: Comprehensive metabolic panel, Lipid panel  -Mildly elevated previously, continue exercise, repeat labs. No medications at this point.  Meds ordered this encounter  Medications  . lisinopril (PRINIVIL,ZESTRIL) 20 MG tablet    Sig: TAKE 1 TABLET (20 MG TOTAL) BY MOUTH DAILY.    Dispense:  90 tablet    Refill:  3   Patient Instructions    Thanks for coming in today. Blood pressure overall looks okay. We will let you know about the lab results. Follow-up in 6 months to recheck blood pressure, plan for prostate cancer testing next year.  Keeping you healthy  Get these tests  Blood pressure- Have your blood pressure checked once a year  by your healthcare provider.  Normal blood pressure is 120/80.  Weight- Have your body mass index (BMI) calculated to screen for obesity.  BMI is a measure of body fat based on height and weight. You can also calculate your own BMI at https://www.west-esparza.com/www.nhlbisupport.com/bmi/.  Cholesterol- Have your cholesterol checked regularly starting at age 40, sooner may be necessary if you have diabetes, high blood pressure, if a family member developed heart diseases at an early age or if you smoke.   Chlamydia, HIV, and other sexual transmitted disease- Get screened each year until the age of 40 then within three months of each new sexual partner.  Diabetes- Have your blood sugar checked regularly if you have high blood pressure, high cholesterol, a family history of diabetes or if you are overweight.  Get these vaccines  Flu shot- Every fall.  Tetanus shot- Every 10 years.  Menactra- Single dose; prevents meningitis.  Take these steps  Don't smoke- If you do smoke, ask your healthcare provider about quitting. For tips on how to quit, go to www.smokefree.gov or call  1-800-QUIT-NOW.  Be physically active- Exercise 5 days a week for at least 30 minutes.  If you are not already physically active start slow and gradually work up to 30 minutes of moderate physical activity.  Examples of moderate activity include walking briskly, mowing the yard, dancing, swimming bicycling, etc.  Eat a healthy diet- Eat a variety of healthy foods such as fruits, vegetables, low fat milk, low fat cheese, yogurt, lean meats, poultry, fish, beans, tofu, etc.  For more information on healthy eating, go to www.thenutritionsource.org  Drink alcohol in moderation- Limit alcohol intake two drinks or less a day.  Never drink and drive.  Dentist- Brush and floss teeth twice daily; visit your dentis twice a year.  Depression-Your emotional health is as important as your physical health.  If you're feeling down, losing interest in things you normally enjoy please talk with your healthcare provider.  Gun Safety- If you keep a gun in your home, keep it unloaded and with the safety lock on.  Bullets should be stored separately.  Helmet use- Always wear a helmet when riding a motorcycle, bicycle, rollerblading or skateboarding.  Safe sex- If you may be exposed to a sexually transmitted infection, use a condom  Seat belts- Seat bels can save your life; always wear one.  Smoke/Carbon Monoxide detectors- These detectors need to be installed on the appropriate level of your home.  Replace batteries at least once a year.  Skin Cancer- When out in the sun, cover up and use sunscreen SPF 15 or higher.  Violence- If anyone is threatening or hurting you, please tell your healthcare provider.  IF you received an x-ray today, you will receive an invoice from [facility]. Please contact [facility] at [phone] with questions or concerns regarding your invoice.   IF you received labwork today, you will receive an invoice from [facility]. Please contact [facility] at [phone]  with questions or concerns regarding your invoice.   Our billing staff will not be able to assist you with questions regarding bills from these companies.  You will be contacted with the lab results as soon as they are available. The fastest way to get your results is to activate your My Chart account. Instructions are located on the last page of this paperwork. If you have not heard from us regarding the results in 2 weeks, please contact this office.       I personally  performed the services described in this documentation, which was scribed in my presence. The recorded information has been reviewed and considered for accuracy and completeness, addended by me as needed, and agree with information above.  Signed,   [name], MD Primary Care at [facility].  12/19/17 8:42 AM

## 2017-12-19 NOTE — Patient Instructions (Signed)
Thanks for coming in today. Blood pressure overall looks okay. We will let you know about the lab results. Follow-up in 6 months to recheck blood pressure, plan for prostate cancer testing next year.  Keeping you healthy  Get these tests  Blood pressure- Have your blood pressure checked once a year by your healthcare provider.  Normal blood pressure is 120/80.  Weight- Have your body mass index (BMI) calculated to screen for obesity.  BMI is a measure of body fat based on height and weight. You can also calculate your own BMI at https://www.west-esparza.com/www.nhlbisupport.com/bmi/.  Cholesterol- Have your cholesterol checked regularly starting at age 40, sooner may be necessary if you have diabetes, high blood pressure, if a family member developed heart diseases at an early age or if you smoke.   Chlamydia, HIV, and other sexual transmitted disease- Get screened each year until the age of 40 then within three months of each new sexual partner.  Diabetes- Have your blood sugar checked regularly if you have high blood pressure, high cholesterol, a family history of diabetes or if you are overweight.  Get these vaccines  Flu shot- Every fall.  Tetanus shot- Every 10 years.  Menactra- Single dose; prevents meningitis.  Take these steps  Don't smoke- If you do smoke, ask your healthcare provider about quitting. For tips on how to quit, go to www.smokefree.gov or call 1-800-QUIT-NOW.  Be physically active- Exercise 5 days a week for at least 30 minutes.  If you are not already physically active start slow and gradually work up to 30 minutes of moderate physical activity.  Examples of moderate activity include walking briskly, mowing the yard, dancing, swimming bicycling, etc.  Eat a healthy diet- Eat a variety of healthy foods such as fruits, vegetables, low fat milk, low fat cheese, yogurt, lean meats, poultry, fish, beans, tofu, etc.  For more information on healthy eating, go to  www.thenutritionsource.org  Drink alcohol in moderation- Limit alcohol intake two drinks or less a day.  Never drink and drive.  Dentist- Brush and floss teeth twice daily; visit your dentis twice a year.  Depression-Your emotional health is as important as your physical health.  If you're feeling down, losing interest in things you normally enjoy please talk with your healthcare provider.  Gun Safety- If you keep a gun in your home, keep it unloaded and with the safety lock on.  Bullets should be stored separately.  Helmet use- Always wear a helmet when riding a motorcycle, bicycle, rollerblading or skateboarding.  Safe sex- If you may be exposed to a sexually transmitted infection, use a condom  Seat belts- Seat bels can save your life; always wear one.  Smoke/Carbon Monoxide detectors- These detectors need to be installed on the appropriate level of your home.  Replace batteries at least once a year.  Skin Cancer- When out in the sun, cover up and use sunscreen SPF 15 or higher.  Violence- If anyone is threatening or hurting you, please tell your healthcare provider.  IF you received an x-ray today, you will receive an invoice from [facility]. Please contact [facility] at [phone] with questions or concerns regarding your invoice.   IF you received labwork today, you will receive an invoice from [facility]. Please contact [facility] at [phone] with questions or concerns regarding your invoice.   Our billing staff will not be able to assist you with questions regarding bills from these companies.  You will be contacted with the lab results as soon as  they are available. The fastest way to get your results is to activate your My Chart account. Instructions are located on the last page of this paperwork. If you have not heard from [place] regarding the results in 2 weeks, please contact this office.

## 2017-12-20 LAB — COMPREHENSIVE METABOLIC PANEL
A/G RATIO: 1.6 (ref 1.2–2.2)
ALT: 23 IU/L (ref 0–44)
AST: 25 IU/L (ref 0–40)
Albumin: 4.7 g/dL (ref 3.5–5.5)
Alkaline Phosphatase: 70 IU/L (ref 39–117)
BILIRUBIN TOTAL: 0.5 mg/dL (ref 0.0–1.2)
BUN/Creatinine Ratio: 10 (ref 9–20)
BUN: 12 mg/dL (ref 6–20)
CHLORIDE: 100 mmol/L (ref 96–106)
CO2: 20 mmol/L (ref 20–29)
Calcium: 9.9 mg/dL (ref 8.7–10.2)
Creatinine, Ser: 1.25 mg/dL (ref 0.76–1.27)
GFR, EST AFRICAN AMERICAN: 83 mL/min/{1.73_m2} (ref >59)
GFR, EST NON AFRICAN AMERICAN: 72 mL/min/{1.73_m2} (ref >59)
GLOBULIN, TOTAL: 2.9 g/dL (ref 1.5–4.5)
Glucose: 77 mg/dL (ref 65–99)
POTASSIUM: 4.5 mmol/L (ref 3.5–5.2)
SODIUM: 138 mmol/L (ref 134–144)
TOTAL PROTEIN: 7.6 g/dL (ref 6.0–8.5)

## 2017-12-20 LAB — LIPID PANEL
CHOL/HDL RATIO: 3.9 ratio (ref 0.0–5.0)
Cholesterol, Total: 205 mg/dL — ABNORMAL HIGH (ref 100–199)
HDL: 52 mg/dL (ref >39)
LDL Calculated: 135 mg/dL — ABNORMAL HIGH (ref 0–99)
Triglycerides: 89 mg/dL (ref 0–149)
VLDL Cholesterol Cal: 18 mg/dL (ref 5–40)

## 2017-12-20 LAB — RPR: RPR: NONREACTIVE

## 2017-12-20 LAB — HIV ANTIBODY (ROUTINE TESTING W REFLEX): HIV SCREEN 4TH GENERATION: NONREACTIVE

## 2017-12-21 LAB — GC/CHLAMYDIA PROBE AMP
Chlamydia trachomatis, NAA: NEGATIVE
Neisseria gonorrhoeae by PCR: NEGATIVE

## 2018-06-24 ENCOUNTER — Other Ambulatory Visit: Payer: Self-pay

## 2018-06-24 ENCOUNTER — Ambulatory Visit (INDEPENDENT_AMBULATORY_CARE_PROVIDER_SITE_OTHER): Payer: Managed Care, Other (non HMO) | Admitting: Family Medicine

## 2018-06-24 ENCOUNTER — Encounter: Payer: Self-pay | Admitting: Family Medicine

## 2018-06-24 VITALS — BP 116/60 | HR 71 | Temp 98.2°F | Ht 73.0 in | Wt 195.0 lb

## 2018-06-24 DIAGNOSIS — E785 Hyperlipidemia, unspecified: Secondary | ICD-10-CM | POA: Diagnosis not present

## 2018-06-24 DIAGNOSIS — I10 Essential (primary) hypertension: Secondary | ICD-10-CM

## 2018-06-24 NOTE — Progress Notes (Signed)
Subjective:  By signing my name below, I, Jerry Johnston, attest that this documentation has been prepared under the direction and in the presence of Shade FloodJeffrey R Leeana Creer, MD Electronically Signed: Charline BillsEssence Johnston, ED Scribe 06/24/2018 at 8:17 AM.   Patient ID: Jerry Johnston, male    DOB: 1976/05/17, 42 y.o.   MRN: [id number]  Chief Complaint  Patient presents with  . Hypertension    Recheck . 7 month f/u   HPI  Jerry Johnston is a 42 y.o. male who presents to Primary Care at [facility] for f/u on HTN. Lisinopril 20 mg qd. Borderline elevated cholesterol, plan to recheck 6 months to 1 yr. - Pt is fasting at this visit. Home readings range from 122-130/60s/85. He is still exercising. Pt reports rare episodes of lightheadedness with standing quickly, about once-twice/month. He has also noticed blood in stools, specifically bright red blood on toilet paper once or twice while constipated and straining. Denies cp, sob, dizziness, melena, abdominal pain.  Lab Results  Component Value Date   CREATININE 1.25 12/19/2017   BP Readings from Last 3 Encounters:  06/24/18 116/60  12/19/17 138/78  06/06/17 133/79   Patient Active Problem List   Diagnosis Date Noted  . Essential hypertension 12/19/2017  . Drug allergy 03/31/2013   Past Medical History:  Diagnosis Date  . Hypertension    Past Surgical History:  Procedure Laterality Date  . WRIST SURGERY     Allergies  Allergen Reactions  . Percocet [Oxycodone-Acetaminophen]     ? Reaction pt stated he had broken out during the time of taking med.   Prior to Admission medications   Medication Sig Start Date End Date Taking? Authorizing Provider  lisinopril (PRINIVIL,ZESTRIL) 20 MG tablet TAKE 1 TABLET (20 MG TOTAL) BY MOUTH DAILY. 12/19/17   Shade FloodGreene, Mariesha Venturella R, MD   Social History   Socioeconomic History  . Marital status: Single    Spouse name: Not on file  . Number of children: Not on file  . Years of education: Not on file  . Highest  education level: Not on file  Occupational History  . Occupation: Psychiatric nurseT Manager  Social Needs  . Financial resource strain: Not on file  . Food insecurity:    Worry: Not on file    Inability: Not on file  . Transportation needs:    Medical: Not on file    Non-medical: Not on file  Tobacco Use  . Smoking status: Never Smoker  . Smokeless tobacco: Never Used  Substance and Sexual Activity  . Alcohol use: Yes    Alcohol/week: 1.2 - 1.8 oz    Types: 2 - 3 Standard drinks or equivalent per week  . Drug use: No  . Sexual activity: Yes    Birth control/protection: Condom  Lifestyle  . Physical activity:    Days per week: Not on file    Minutes per session: Not on file  . Stress: Not on file  Relationships  . Social connections:    Talks on phone: Not on file    Gets together: Not on file    Attends religious service: Not on file    Active member of club or organization: Not on file    Attends meetings of clubs or organizations: Not on file    Relationship status: Not on file  . Intimate partner violence:    Fear of current or ex partner: Not on file    Emotionally abused: Not on file    Physically  abused: Not on file    Forced sexual activity: Not on file  Other Topics Concern  . Not on file  Social History Narrative   Single. Education: [employer].   Exercise: Yes   Review of Systems  Constitutional: Negative for fatigue and unexpected weight change.  Eyes: Negative for visual disturbance.  Respiratory: Negative for cough, chest tightness and shortness of breath.   Cardiovascular: Negative for chest pain, palpitations and leg swelling.  Gastrointestinal: Positive for blood in stool (rare). Negative for abdominal pain.  Neurological: Positive for light-headedness (rare). Negative for dizziness and headaches.      Objective:   Physical Exam  Constitutional: He is oriented to person, place, and time. He appears well-developed and well-nourished.  HENT:  Head: Normocephalic  and atraumatic.  Eyes: Pupils are equal, round, and reactive to light. EOM are normal.  Neck: No JVD present. Carotid bruit is not present.  Cardiovascular: Normal rate, regular rhythm and normal heart sounds.  No murmur heard. Pulmonary/Chest: Effort normal and breath sounds normal. He has no rales.  Musculoskeletal: He exhibits no edema.  Neurological: He is alert and oriented to person, place, and time.  Skin: Skin is warm and dry.  Psychiatric: He has a normal mood and affect.  Vitals reviewed.  Vitals:   06/24/18 0805 06/24/18 0806  BP: (!) 145/78 116/60  Pulse: 71   Temp: 98.2 F (36.8 C)   TempSrc: Oral   SpO2: 96%   Weight: 195 lb (88.5 kg)   Height: 6\' 1"  (1.854 m)       Assessment & Plan:  Jerry Johnston is a 42 y.o. male Hyperlipidemia, unspecified hyperlipidemia type - Plan: Comprehensive metabolic panel, Lipid panel  -Mildly elevated previously, has continued to work on diet and exercise.  Recheck levels today.  Essential hypertension - Plan: Comprehensive metabolic panel  -Overall stable readings, rare lightheadedness.  If the symptoms are more persistent, would consider lower dose.  Labs pending  Did report rare bright red blood with BMs.  Appears to be associated more with hard stools or constipation.  Possible hemorrhoids versus fissure.  If symptoms return, follow-up for exam and to look into causes further  No orders of the defined types were placed in this encounter.  Patient Instructions   BP readings look ok. If any increase in lightheadedness, let me know as we may need to adjust meds.   Rare blood on tissue could be from hemorrhoid, but return for exam and to discuss those symptoms further if they return.   Thanks for coming in today.   IF you received an x-ray today, you will receive an invoice from [facility]. Please contact [facility] at [phone] with questions or concerns regarding your invoice.   IF you received  labwork today, you will receive an invoice from [facility]. Please contact [facility] at [phone] with questions or concerns regarding your invoice.   Our billing staff will not be able to assist you with questions regarding bills from these companies.  You will be contacted with the lab results as soon as they are available. The fastest way to get your results is to activate your My Chart account. Instructions are located on the last page of this paperwork. If you have not heard from [place] regarding the results in 2 weeks, please contact this office.       I personally performed the services described in this documentation, which was scribed in my presence. The recorded information has been reviewed and considered  for accuracy and completeness, addended by me as needed, and agree with information above.  Signed,   [name], MD Primary Care at [facility].  06/24/18 8:28 AM

## 2018-06-24 NOTE — Patient Instructions (Addendum)
BP readings look ok. If any increase in lightheadedness, let me know as we may need to adjust meds.   Rare blood on tissue could be from hemorrhoid, but return for exam and to discuss those symptoms further if they return.   Thanks for coming in today.   IF you received an x-ray today, you will receive an invoice from [facility]. Please contact [facility] at [phone] with questions or concerns regarding your invoice.   IF you received labwork today, you will receive an invoice from [facility]. Please contact [facility] at [phone] with questions or concerns regarding your invoice.   Our billing staff will not be able to assist you with questions regarding bills from these companies.  You will be contacted with the lab results as soon as they are available. The fastest way to get your results is to activate your My Chart account. Instructions are located on the last page of this paperwork. If you have not heard from us regarding the results in 2 weeks, please contact this office.

## 2018-06-25 LAB — LIPID PANEL
Chol/HDL Ratio: 4.2 ratio (ref 0.0–5.0)
Cholesterol, Total: 216 mg/dL — ABNORMAL HIGH (ref 100–199)
HDL: 52 mg/dL (ref >39)
LDL Calculated: 151 mg/dL — ABNORMAL HIGH (ref 0–99)
TRIGLYCERIDES: 67 mg/dL (ref 0–149)
VLDL CHOLESTEROL CAL: 13 mg/dL (ref 5–40)

## 2018-06-25 LAB — COMPREHENSIVE METABOLIC PANEL
A/G RATIO: 1.6 (ref 1.2–2.2)
ALBUMIN: 4.5 g/dL (ref 3.5–5.5)
ALK PHOS: 73 IU/L (ref 39–117)
ALT: 26 IU/L (ref 0–44)
AST: 31 IU/L (ref 0–40)
BUN / CREAT RATIO: 13 (ref 9–20)
BUN: 16 mg/dL (ref 6–20)
Bilirubin Total: 0.4 mg/dL (ref 0.0–1.2)
CO2: 22 mmol/L (ref 20–29)
CREATININE: 1.27 mg/dL (ref 0.76–1.27)
Calcium: 9.5 mg/dL (ref 8.7–10.2)
Chloride: 102 mmol/L (ref 96–106)
GFR calc Af Amer: 82 mL/min/{1.73_m2} (ref >59)
GFR, EST NON AFRICAN AMERICAN: 71 mL/min/{1.73_m2} (ref >59)
GLOBULIN, TOTAL: 2.9 g/dL (ref 1.5–4.5)
Glucose: 89 mg/dL (ref 65–99)
POTASSIUM: 4.7 mmol/L (ref 3.5–5.2)
SODIUM: 138 mmol/L (ref 134–144)
Total Protein: 7.4 g/dL (ref 6.0–8.5)

## 2018-12-29 ENCOUNTER — Ambulatory Visit (INDEPENDENT_AMBULATORY_CARE_PROVIDER_SITE_OTHER): Payer: Managed Care, Other (non HMO) | Admitting: Family Medicine

## 2018-12-29 ENCOUNTER — Other Ambulatory Visit: Payer: Self-pay

## 2018-12-29 ENCOUNTER — Encounter: Payer: Self-pay | Admitting: Family Medicine

## 2018-12-29 VITALS — BP 118/79 | HR 66 | Temp 97.6°F | Resp 14 | Ht 73.0 in | Wt 197.8 lb

## 2018-12-29 DIAGNOSIS — Z125 Encounter for screening for malignant neoplasm of prostate: Secondary | ICD-10-CM | POA: Diagnosis not present

## 2018-12-29 DIAGNOSIS — E785 Hyperlipidemia, unspecified: Secondary | ICD-10-CM | POA: Diagnosis not present

## 2018-12-29 DIAGNOSIS — Z1329 Encounter for screening for other suspected endocrine disorder: Secondary | ICD-10-CM

## 2018-12-29 DIAGNOSIS — Z0001 Encounter for general adult medical examination with abnormal findings: Secondary | ICD-10-CM

## 2018-12-29 DIAGNOSIS — Z113 Encounter for screening for infections with a predominantly sexual mode of transmission: Secondary | ICD-10-CM

## 2018-12-29 DIAGNOSIS — Z0000 Encounter for general adult medical examination without abnormal findings: Secondary | ICD-10-CM

## 2018-12-29 NOTE — Progress Notes (Signed)
Subjective:    Patient ID: Jerry Johnston, male    DOB: [birth year], 43 y.o.   MRN: [id number]  HPI Jerry Johnston is a 43 y.o. male Presents today for: Chief Complaint  Patient presents with  . Annual Exam    not having any issus at this time, in good health, here for a wellness physical   Hypertension: BP Readings from Last 3 Encounters:  12/29/18 118/79  06/24/18 116/60  12/19/17 138/78   Lab Results  Component Value Date   CREATININE 1.27 06/24/2018  On lisinopril 20 mg daily. Home BP: 111-139/72-85.  Usual 110-120s/70's. Has RF's on hand.   Lipid screening: Lab Results  Component Value Date   CHOL 216 (H) 06/24/2018   HDL 52 06/24/2018   LDLCALC 151 (H) 06/24/2018   TRIG 67 06/24/2018   CHOLHDL 4.2 06/24/2018  The 10-year ASCVD risk score Denman George DC Jr., et al., 2013) is: 4.5%   Values used to calculate the score:     Age: 65 years     Sex: Male     Is Non-Hispanic African American: Yes     Diabetic: No     Tobacco smoker: No     Systolic Blood Pressure: 118 mmHg     Is BP treated: Yes     HDL Cholesterol: 52 mg/dL     Total Cholesterol: 216 mg/dL Has changed diet.  Wt Readings from Last 3 Encounters:  12/29/18 197 lb 12.8 oz (89.7 kg)  06/24/18 195 lb (88.5 kg)  12/19/17 193 lb (87.5 kg)   Cancer screening: Father with hx of prostate CA in 60's. Plans on PSA today, defers DRE at present.    Immunization History  Administered Date(s) Administered  . Hepatitis A 03/28/2007, 09/30/2007  . Hepatitis B 03/28/2007, 04/28/2007, 09/30/2007, 05/31/2014  . Hepatitis B, adult 05/31/2014  . Influenza Split 09/30/2013, 09/30/2014  . Influenza,inj,Quad PF,6+ Mos 12/05/2015, 12/06/2016  . Tdap 03/28/2007, 06/06/2017    Depression screen PHQ 2/9 12/29/2018 06/24/2018 12/19/2017 06/06/2017 12/06/2016  Decreased Interest 0 0 0 0 0  Down, Depressed, Hopeless 0 0 0 0 0  PHQ - 2 Score 0 0 0 0 0    No exam data present  Saw optho last month - normal glaucoma screen  ([facility]).   Dental: last month  Exercise: 4 days per week.   Would like STI screening. No new partners.  Patient Active Problem List   Diagnosis Date Noted  . Essential hypertension 12/19/2017  . Drug allergy 03/31/2013   Past Medical History:  Diagnosis Date  . Hypertension    Past Surgical History:  Procedure Laterality Date  . WRIST SURGERY     Allergies  Allergen Reactions  . Percocet [Oxycodone-Acetaminophen]     ? Reaction pt stated he had broken out during the time of taking med.   Prior to Admission medications   Medication Sig Start Date End Date Taking? Authorizing Provider  lisinopril (PRINIVIL,ZESTRIL) 20 MG tablet TAKE 1 TABLET (20 MG TOTAL) BY MOUTH DAILY. 12/19/17   [name], MD   Social History   Socioeconomic History  . Marital status: Single    Spouse name: Not on file  . Number of children: Not on file  . Years of education: Not on file  . Highest education level: Not on file  Occupational History  . Occupation: Psychiatric nurse  Social Needs  . Financial resource strain: Not on file  . Food insecurity:    Worry: Not on file  Inability: Not on file  . Transportation needs:    Medical: Not on file    Non-medical: Not on file  Tobacco Use  . Smoking status: Never Smoker  . Smokeless tobacco: Never Used  Substance and Sexual Activity  . Alcohol use: Yes    Alcohol/week: 2.0 - 3.0 standard drinks    Types: 2 - 3 Standard drinks or equivalent per week  . Drug use: No  . Sexual activity: Yes    Birth control/protection: Condom  Lifestyle  . Physical activity:    Days per week: Not on file    Minutes per session: Not on file  . Stress: Not on file  Relationships  . Social connections:    Talks on phone: Not on file    Gets together: Not on file    Attends religious service: Not on file    Active member of club or organization: Not on file    Attends meetings of clubs or organizations: Not on file    Relationship status: Not on file  .  Intimate partner violence:    Fear of current or ex partner: Not on file    Emotionally abused: Not on file    Physically abused: Not on file    Forced sexual activity: Not on file  Other Topics Concern  . Not on file  Social History Narrative   Single. Education: [employer].   Exercise: Yes    Review of Systems 13 point review of systems per patient health survey noted.  Negative other than as indicated above or in HPI.      Objective:   Physical Exam Vitals signs reviewed.  Constitutional:      Appearance: He is well-developed.  HENT:     Head: Normocephalic and atraumatic.     Right Ear: External ear normal.     Left Ear: External ear normal.  Eyes:     Conjunctiva/sclera: Conjunctivae normal.     Pupils: Pupils are equal, round, and reactive to light.  Neck:     Musculoskeletal: Normal range of motion and neck supple.     Thyroid: No thyromegaly.  Cardiovascular:     Rate and Rhythm: Normal rate and regular rhythm.     Heart sounds: Normal heart sounds.  Pulmonary:     Effort: Pulmonary effort is normal. No respiratory distress.     Breath sounds: Normal breath sounds. No wheezing.  Abdominal:     General: There is no distension.     Palpations: Abdomen is soft.     Tenderness: There is no abdominal tenderness.  Musculoskeletal: Normal range of motion.        General: No tenderness.  Lymphadenopathy:     Cervical: No cervical adenopathy.  Skin:    General: Skin is warm and dry.  Neurological:     Mental Status: He is alert and oriented to person, place, and time.     Deep Tendon Reflexes: Reflexes are normal and symmetric.  Psychiatric:        Behavior: Behavior normal.    Vitals:   12/29/18 0803  BP: 118/79  Pulse: 66  Resp: 14  Temp: 97.6 F (36.4 C)  TempSrc: Oral  SpO2: 98%  Weight: 197 lb 12.8 oz (89.7 kg)  Height: 6\' 1"  (1.854 m)       Assessment & Plan:  Jerry Johnston is a 43 y.o. male Annual physical exam  - -anticipatory guidance as  below in AVS, screening labs above. Health maintenance items  as above in HPI discussed/recommended as applicable.   Hyperlipidemia, unspecified hyperlipidemia type - Plan: Comprehensive metabolic panel, Lipid Panel   Screening for malignant neoplasm of prostate - Plan: PSA  - We discussed pros and cons of prostate cancer screening, and after this discussion, he chose to have screening done with PSA only   Screening for thyroid disorder - Plan: TSH  Routine screening for STI (sexually transmitted infection) - Plan: GC/Chlamydia Probe Amp, RPR, HIV Antibody (routine testing w rflx)   No orders of the defined types were placed in this encounter.  Patient Instructions     Keeping you healthy  Get these tests  Blood pressure- Have your blood pressure checked once a year by your healthcare provider.  Normal blood pressure is 120/80.  Weight- Have your body mass index (BMI) calculated to screen for obesity.  BMI is a measure of body fat based on height and weight. You can also calculate your own BMI at https://www.west-esparza.com/www.nhlbisupport.com/bmi/.  Cholesterol- Have your cholesterol checked regularly starting at age 41, sooner may be necessary if you have diabetes, high blood pressure, if a family member developed heart diseases at an early age or if you smoke.   Chlamydia, HIV, and other sexual transmitted disease- Get screened each year until the age of 41 then within three months of each new sexual partner.  Diabetes- Have your blood sugar checked regularly if you have high blood pressure, high cholesterol, a family history of diabetes or if you are overweight.  Get these vaccines  Flu shot- Every fall.  Tetanus shot- Every 10 years.  Menactra- Single dose; prevents meningitis.  Take these steps  Don't smoke- If you do smoke, ask your healthcare provider about quitting. For tips on how to quit, go to www.smokefree.gov or call 1-800-QUIT-NOW.  Be physically active- Exercise 5 days a week for at  least 30 minutes.  If you are not already physically active start slow and gradually work up to 30 minutes of moderate physical activity.  Examples of moderate activity include walking briskly, mowing the yard, dancing, swimming bicycling, etc.  Eat a healthy diet- Eat a variety of healthy foods such as fruits, vegetables, low fat milk, low fat cheese, yogurt, lean meats, poultry, fish, beans, tofu, etc.  For more information on healthy eating, go to www.thenutritionsource.org  Drink alcohol in moderation- Limit alcohol intake two drinks or less a day.  Never drink and drive.  Dentist- Brush and floss teeth twice daily; visit your dentis twice a year.  Depression-Your emotional health is as important as your physical health.  If you're feeling down, losing interest in things you normally enjoy please talk with your healthcare provider.  Gun Safety- If you keep a gun in your home, keep it unloaded and with the safety lock on.  Bullets should be stored separately.  Helmet use- Always wear a helmet when riding a motorcycle, bicycle, rollerblading or skateboarding.  Safe sex- If you may be exposed to a sexually transmitted infection, use a condom  Seat belts- Seat bels can save your life; always wear one.  Smoke/Carbon Monoxide detectors- These detectors need to be installed on the appropriate level of your home.  Replace batteries at least once a year.  Skin Cancer- When out in the sun, cover up and use sunscreen SPF 15 or higher.  Violence- If anyone is threatening or hurting you, please tell your healthcare provider.  If you have lab work done today you will be contacted with your lab results  within the next 2 weeks.  If you have not heard from us then please contact us. The fastest way to get your results is to register for My Chart.   IF you received an x-ray today, you will receive an invoice from [facility]. Please contact [facility] at [phone] with questions or  concerns regarding your invoice.   IF you received labwork today, you will receive an invoice from [facility]. Please contact [facility] at [phone] with questions or concerns regarding your invoice.   Our billing staff will not be able to assist you with questions regarding bills from these companies.  You will be contacted with the lab results as soon as they are available. The fastest way to get your results is to activate your My Chart account. Instructions are located on the last page of this paperwork. If you have not heard from us regarding the results in 2 weeks, please contact this office.       Signed,   Meredith StaggersJeffrey Chia Mowers, MD Primary Care at [facility].  12/29/18 8:29 AM

## 2018-12-29 NOTE — Patient Instructions (Addendum)
°Keeping you healthy ° °Get these tests °· Blood pressure- Have your blood pressure checked once a year by your healthcare provider.  Normal blood pressure is 120/80. °· Weight- Have your body mass index (BMI) calculated to screen for obesity.  BMI is a measure of body fat based on height and weight. You can also calculate your own BMI at www.nhlbisupport.com/bmi/. °· Cholesterol- Have your cholesterol checked regularly starting at age 41, sooner may be necessary if you have diabetes, high blood pressure, if a family member developed heart diseases at an early age or if you smoke.  °· Chlamydia, HIV, and other sexual transmitted disease- Get screened each year until the age of 25 then within three months of each new sexual partner. °· Diabetes- Have your blood sugar checked regularly if you have high blood pressure, high cholesterol, a family history of diabetes or if you are overweight. ° °Get these vaccines °· Flu shot- Every fall. °· Tetanus shot- Every 10 years. °· Menactra- Single dose; prevents meningitis. ° °Take these steps °· Don't smoke- If you do smoke, ask your healthcare provider about quitting. For tips on how to quit, go to www.smokefree.gov or call 1-800-QUIT-NOW. °· Be physically active- Exercise 5 days a week for at least 30 minutes.  If you are not already physically active start slow and gradually work up to 30 minutes of moderate physical activity.  Examples of moderate activity include walking briskly, mowing the yard, dancing, swimming bicycling, etc. °· Eat a healthy diet- Eat a variety of healthy foods such as fruits, vegetables, low fat milk, low fat cheese, yogurt, lean meats, poultry, fish, beans, tofu, etc.  For more information on healthy eating, go to www.thenutritionsource.org °· Drink alcohol in moderation- Limit alcohol intake two drinks or less a day.  Never drink and drive. °· Dentist- Brush and floss teeth twice daily; visit your dentis twice a year. °· Depression-Your emotional  health is as important as your physical health.  If you're feeling down, losing interest in things you normally enjoy please talk with your healthcare provider. °· Gun Safety- If you keep a gun in your home, keep it unloaded and with the safety lock on.  Bullets should be stored separately. °· Helmet use- Always wear a helmet when riding a motorcycle, bicycle, rollerblading or skateboarding. °· Safe sex- If you may be exposed to a sexually transmitted infection, use a condom °· Seat belts- Seat bels can save your life; always wear one. °· Smoke/Carbon Monoxide detectors- These detectors need to be installed on the appropriate level of your home.  Replace batteries at least once a year. °· Skin Cancer- When out in the sun, cover up and use sunscreen SPF 15 or higher. °· Violence- If anyone is threatening or hurting you, please tell your healthcare provider. ° ° ° °If you have lab work done today you will be contacted with your lab results within the next 2 weeks.  If you have not heard from us then please contact us. The fastest way to get your results is to register for My Chart. ° ° °IF you received an x-ray today, you will receive an invoice from [facility]. Please contact [facility] at [phone] with questions or concerns regarding your invoice.  ° °IF you received labwork today, you will receive an invoice from [facility]. Please contact [facility] at [phone] with questions or concerns regarding your invoice.  ° °Our billing staff will not be able to assist you with questions regarding bills from   these companies. ° °You will be contacted with the lab results as soon as they are available. The fastest way to get your results is to activate your My Chart account. Instructions are located on the last page of this paperwork. If you have not heard from us regarding the results in 2 weeks, please contact this office. °  ° ° °

## 2018-12-30 LAB — PSA: Prostate Specific Ag, Serum: 1.4 ng/mL (ref 0.0–4.0)

## 2018-12-30 LAB — COMPREHENSIVE METABOLIC PANEL
A/G RATIO: 1.7 (ref 1.2–2.2)
ALBUMIN: 4.6 g/dL (ref 3.5–5.5)
ALT: 51 IU/L — ABNORMAL HIGH (ref 0–44)
AST: 34 IU/L (ref 0–40)
Alkaline Phosphatase: 83 IU/L (ref 39–117)
BUN / CREAT RATIO: 8 — AB (ref 9–20)
BUN: 10 mg/dL (ref 6–24)
Bilirubin Total: 0.5 mg/dL (ref 0.0–1.2)
CALCIUM: 9.7 mg/dL (ref 8.7–10.2)
CO2: 23 mmol/L (ref 20–29)
CREATININE: 1.24 mg/dL (ref 0.76–1.27)
Chloride: 104 mmol/L (ref 96–106)
GFR calc non Af Amer: 72 mL/min/{1.73_m2} (ref >59)
GFR, EST AFRICAN AMERICAN: 84 mL/min/{1.73_m2} (ref >59)
GLOBULIN, TOTAL: 2.7 g/dL (ref 1.5–4.5)
Glucose: 88 mg/dL (ref 65–99)
POTASSIUM: 4.7 mmol/L (ref 3.5–5.2)
SODIUM: 141 mmol/L (ref 134–144)
TOTAL PROTEIN: 7.3 g/dL (ref 6.0–8.5)

## 2018-12-30 LAB — RPR: RPR Ser Ql: NONREACTIVE

## 2018-12-30 LAB — LIPID PANEL
CHOLESTEROL TOTAL: 199 mg/dL (ref 100–199)
Chol/HDL Ratio: 3.8 ratio (ref 0.0–5.0)
HDL: 52 mg/dL (ref >39)
LDL Calculated: 126 mg/dL — ABNORMAL HIGH (ref 0–99)
Triglycerides: 106 mg/dL (ref 0–149)
VLDL Cholesterol Cal: 21 mg/dL (ref 5–40)

## 2018-12-30 LAB — TSH: TSH: 2.06 u[IU]/mL (ref 0.450–4.500)

## 2018-12-30 LAB — HIV ANTIBODY (ROUTINE TESTING W REFLEX): HIV Screen 4th Generation wRfx: NONREACTIVE

## 2018-12-31 LAB — GC/CHLAMYDIA PROBE AMP
CHLAMYDIA, DNA PROBE: NEGATIVE
Neisseria gonorrhoeae by PCR: NEGATIVE

## 2019-03-16 ENCOUNTER — Other Ambulatory Visit: Payer: Self-pay | Admitting: Family Medicine

## 2019-03-16 DIAGNOSIS — I10 Essential (primary) hypertension: Secondary | ICD-10-CM

## 2019-06-17 ENCOUNTER — Other Ambulatory Visit: Payer: Self-pay | Admitting: Family Medicine

## 2019-06-17 DIAGNOSIS — I10 Essential (primary) hypertension: Secondary | ICD-10-CM

## 2019-06-29 ENCOUNTER — Ambulatory Visit (INDEPENDENT_AMBULATORY_CARE_PROVIDER_SITE_OTHER): Payer: Managed Care, Other (non HMO) | Admitting: Family Medicine

## 2019-06-29 ENCOUNTER — Other Ambulatory Visit: Payer: Self-pay

## 2019-06-29 ENCOUNTER — Encounter: Payer: Self-pay | Admitting: Family Medicine

## 2019-06-29 DIAGNOSIS — E785 Hyperlipidemia, unspecified: Secondary | ICD-10-CM | POA: Diagnosis not present

## 2019-06-29 DIAGNOSIS — I10 Essential (primary) hypertension: Secondary | ICD-10-CM

## 2019-06-29 NOTE — Patient Instructions (Addendum)
   No med changes at this time.  Recheck in 6 months.   Thanks for coming in today. Let me know if there are questions and stay safe.     If you have lab work done today you will be contacted with your lab results within the next 2 weeks.  If you have not heard from [place] then please contact us. The fastest way to get your results is to register for My Chart.   IF you received an x-ray today, you will receive an invoice from [facility]. Please contact [facility] at [phone] with questions or concerns regarding your invoice.   IF you received labwork today, you will receive an invoice from [place]. Please contact [facility] at [phone] with questions or concerns regarding your invoice.   Our billing staff will not be able to assist you with questions regarding bills from these companies.  You will be contacted with the lab results as soon as they are available. The fastest way to get your results is to activate your My Chart account. Instructions are located on the last page of this paperwork. If you have not heard from [place] regarding the results in 2 weeks, please contact this office.

## 2019-06-29 NOTE — Progress Notes (Signed)
Subjective:    Patient ID: Jerry Johnston, male    DOB: 08-15-1976, 43 y.o.   MRN: [id number]  HPI Jerry ArMarion E Shi is a 43 y.o. male Presents today for: Chief Complaint  Patient presents with  . Hypertension    73mo f/u for hypertesion. Bp is an avarge of 114/69-127/77   Hypertension: BP Readings from Last 3 Encounters:  12/29/18 118/79  06/24/18 116/60  12/19/17 138/78   Lab Results  Component Value Date   CREATININE 1.24 12/29/2018  lisinopril 20mg  qd. No cough/no new side effects.  Constitutional: Negative for fatigue and unexpected weight change.  Eyes: Negative for visual disturbance.  Respiratory: Negative for cough, chest tightness and shortness of breath.   Cardiovascular: Negative for chest pain, palpitations and leg swelling.  Gastrointestinal: Negative for abdominal pain and blood in stool.  Neurological: Negative for dizziness, light-headedness and headaches.   Home readings 114-127/69-77.   Mild LDL elevation at 126 in January.  Cut back on meat since 08/2018, decreased fried/fatty foods. Min comfort foods at times.   The 10-year ASCVD risk score Denman George(Goff DC Montez HagemanJr., et al., 2013) is: 4.6%   Values used to calculate the score:     Age: 43 years     Sex: Male     Is Non-Hispanic African American: Yes     Diabetic: No     Tobacco smoker: No     Systolic Blood Pressure: 118 mmHg     Is BP treated: Yes     HDL Cholesterol: 52 mg/dL     Total Cholesterol: 199 mg/dL .   Elevated LFT: Mild single elevation last ov. Few drinks few days before that testing.  No alcohol for months.    Wt Readings from Last 3 Encounters:  12/29/18 197 lb 12.8 oz (89.7 kg)  06/24/18 195 lb (88.5 kg)  12/19/17 193 lb (87.5 kg)  wt 196 today.  Some decreased activity as gym closed with pandemic.      Patient Active Problem List   Diagnosis Date Noted  . Essential hypertension 12/19/2017  . Drug allergy 03/31/2013   Past Medical History:  Diagnosis Date  . Hypertension     Past Surgical History:  Procedure Laterality Date  . WRIST SURGERY     Allergies  Allergen Reactions  . Percocet [Oxycodone-Acetaminophen]     ? Reaction pt stated he had broken out during the time of taking med.   Prior to Admission medications   Medication Sig Start Date End Date Taking? Authorizing Provider  lisinopril (ZESTRIL) 20 MG tablet TAKE 1 TABLET BY MOUTH EVERY DAY.KEEP UPCOMING APPT 06/17/19  Yes Shade FloodGreene, Ford Peddie R, MD   Social History   Socioeconomic History  . Marital status: Single    Spouse name: Not on file  . Number of children: Not on file  . Years of education: Not on file  . Highest education level: Not on file  Occupational History  . Occupation: Psychiatric nurseT Manager  Social Needs  . Financial resource strain: Not on file  . Food insecurity    Worry: Not on file    Inability: Not on file  . Transportation needs    Medical: Not on file    Non-medical: Not on file  Tobacco Use  . Smoking status: Never Smoker  . Smokeless tobacco: Never Used  Substance and Sexual Activity  . Alcohol use: Yes    Alcohol/week: 2.0 - 3.0 standard drinks    Types: 2 - 3 Standard drinks or equivalent  per week  . Drug use: No  . Sexual activity: Yes    Birth control/protection: Condom  Lifestyle  . Physical activity    Days per week: Not on file    Minutes per session: Not on file  . Stress: Not on file  Relationships  . Social Musicianconnections    Talks on phone: Not on file    Gets together: Not on file    Attends religious service: Not on file    Active member of club or organization: Not on file    Attends meetings of clubs or organizations: Not on file    Relationship status: Not on file  . Intimate partner violence    Fear of current or ex partner: Not on file    Emotionally abused: Not on file    Physically abused: Not on file    Forced sexual activity: Not on file  Other Topics Concern  . Not on file  Social History Narrative   Single. Education: [employer].    Exercise: Yes      Review of Systems  Constitutional: Negative for fatigue and unexpected weight change.  Eyes: Negative for visual disturbance.  Respiratory: Negative for cough, chest tightness and shortness of breath.   Cardiovascular: Negative for chest pain, palpitations and leg swelling.  Gastrointestinal: Negative for abdominal pain and blood in stool.  Neurological: Negative for dizziness, light-headedness and headaches.       Objective:   Physical Exam Vitals signs reviewed.  Constitutional:      Appearance: He is well-developed.  HENT:     Head: Normocephalic and atraumatic.  Eyes:     Pupils: Pupils are equal, round, and reactive to light.  Neck:     Vascular: No carotid bruit or JVD.  Cardiovascular:     Rate and Rhythm: Normal rate and regular rhythm.     Heart sounds: Normal heart sounds. No murmur.  Pulmonary:     Effort: Pulmonary effort is normal.     Breath sounds: Normal breath sounds. No rales.  Skin:    General: Skin is warm and dry.  Neurological:     Mental Status: He is alert and oriented to person, place, and time.    There were no vitals filed for this visit.       Assessment & Plan:  Jerry ArMarion E [name] is a 43 y.o. male Hyperlipidemia, unspecified hyperlipidemia type - Plan: Lipid Panel  -Repeat testing, has been significant diet changes.  Medically statin needed based on prior ASCVD risk.  Essential hypertension - Plan: Comprehensive metabolic panel  -Stable with home readings in office.  Tolerating current regimen, continue lisinopril.  He will call when refills needed as has sufficient quantity at the present time  Recheck 6 months  No orders of the defined types were placed in this encounter.  Patient Instructions     No med changes at this time.  Recheck in 6 months.   Thanks for coming in today. Let me know if there are questions and stay safe.     If you have lab work done today you will be contacted with your lab  results within the next 2 weeks.  If you have not heard from us then please contact us. The fastest way to get your results is to register for My Chart.   IF you received an x-ray today, you will receive an invoice from [facility]. Please contact [facility] at [phone] with questions or concerns regarding your invoice.  IF you received labwork today, you will receive an invoice from [place]. Please contact [facility] at [phone] with questions or concerns regarding your invoice.   Our billing staff will not be able to assist you with questions regarding bills from these companies.  You will be contacted with the lab results as soon as they are available. The fastest way to get your results is to activate your My Chart account. Instructions are located on the last page of this paperwork. If you have not heard from [place] regarding the results in 2 weeks, please contact this office.       Signed,   Merri Ray, MD [facility].  06/29/19 6:38 PM

## 2019-06-30 LAB — LIPID PANEL
Chol/HDL Ratio: 3.8 ratio (ref 0.0–5.0)
Cholesterol, Total: 195 mg/dL (ref 100–199)
HDL: 52 mg/dL (ref >39)
LDL Calculated: 127 mg/dL — ABNORMAL HIGH (ref 0–99)
Triglycerides: 79 mg/dL (ref 0–149)
VLDL Cholesterol Cal: 16 mg/dL (ref 5–40)

## 2019-06-30 LAB — COMPREHENSIVE METABOLIC PANEL
ALT: 24 IU/L (ref 0–44)
AST: 25 IU/L (ref 0–40)
Albumin/Globulin Ratio: 1.8 (ref 1.2–2.2)
Albumin: 4.8 g/dL (ref 4.0–5.0)
Alkaline Phosphatase: 79 IU/L (ref 39–117)
BUN/Creatinine Ratio: 8 — ABNORMAL LOW (ref 9–20)
BUN: 10 mg/dL (ref 6–24)
Bilirubin Total: 0.6 mg/dL (ref 0.0–1.2)
CO2: 18 mmol/L — ABNORMAL LOW (ref 20–29)
Calcium: 10.1 mg/dL (ref 8.7–10.2)
Chloride: 104 mmol/L (ref 96–106)
Creatinine, Ser: 1.26 mg/dL (ref 0.76–1.27)
GFR calc Af Amer: 81 mL/min/{1.73_m2} (ref >59)
GFR calc non Af Amer: 70 mL/min/{1.73_m2} (ref >59)
Globulin, Total: 2.6 g/dL (ref 1.5–4.5)
Glucose: 86 mg/dL (ref 65–99)
Potassium: 4.5 mmol/L (ref 3.5–5.2)
Sodium: 139 mmol/L (ref 134–144)
Total Protein: 7.4 g/dL (ref 6.0–8.5)

## 2019-09-12 ENCOUNTER — Other Ambulatory Visit: Payer: Self-pay | Admitting: Family Medicine

## 2019-09-12 DIAGNOSIS — I10 Essential (primary) hypertension: Secondary | ICD-10-CM

## 2020-01-01 ENCOUNTER — Encounter: Payer: Self-pay | Admitting: Family Medicine

## 2020-01-01 ENCOUNTER — Other Ambulatory Visit (HOSPITAL_COMMUNITY)
Admission: RE | Admit: 2020-01-01 | Discharge: 2020-01-01 | Disposition: A | Discharge status: Home / Self Care | Payer: Managed Care, Other (non HMO) | Source: Ambulatory Visit | Attending: Family Medicine | Admitting: Family Medicine

## 2020-01-01 ENCOUNTER — Ambulatory Visit (INDEPENDENT_AMBULATORY_CARE_PROVIDER_SITE_OTHER): Payer: Managed Care, Other (non HMO) | Admitting: Family Medicine

## 2020-01-01 ENCOUNTER — Other Ambulatory Visit: Payer: Self-pay

## 2020-01-01 VITALS — BP 124/82 | HR 70 | Temp 98.5°F | Ht 73.0 in | Wt 197.0 lb

## 2020-01-01 DIAGNOSIS — Z113 Encounter for screening for infections with a predominantly sexual mode of transmission: Secondary | ICD-10-CM

## 2020-01-01 DIAGNOSIS — I10 Essential (primary) hypertension: Secondary | ICD-10-CM | POA: Diagnosis not present

## 2020-01-01 DIAGNOSIS — Z0001 Encounter for general adult medical examination with abnormal findings: Secondary | ICD-10-CM

## 2020-01-01 DIAGNOSIS — E785 Hyperlipidemia, unspecified: Secondary | ICD-10-CM

## 2020-01-01 DIAGNOSIS — Z8042 Family history of malignant neoplasm of prostate: Secondary | ICD-10-CM

## 2020-01-01 DIAGNOSIS — Z125 Encounter for screening for malignant neoplasm of prostate: Secondary | ICD-10-CM

## 2020-01-01 DIAGNOSIS — Z0000 Encounter for general adult medical examination without abnormal findings: Secondary | ICD-10-CM

## 2020-01-01 MED ORDER — LISINOPRIL 20 MG PO TABS: ORAL_TABLET | ORAL | 2 refills | Status: DC

## 2020-01-01 NOTE — Progress Notes (Signed)
Subjective:  Patient ID: Jerry Johnston, male    DOB: Oct 04, 1976  Age: 44 y.o. MRN: [id number]  CC:  Chief Complaint  Patient presents with  . Annual Exam    general health pt feels good with no complaints.    HPI Jerry Johnston presents for   Annual physical exam.  History of hypertension.  Hypertension: Takes lisinopril 20 mg daily Home readings: no recent readings. No new side effects with meds.  BP Readings from Last 3 Encounters:  01/01/20 124/82  12/29/18 118/79  06/24/18 116/60   Lab Results  Component Value Date   CREATININE 1.26 06/29/2019   Hyperlipidemia: No meds.  Lab Results  Component Value Date   CHOL 195 06/29/2019   HDL 52 06/29/2019   LDLCALC 127 (H) 06/29/2019   TRIG 79 06/29/2019   CHOLHDL 3.8 06/29/2019   Lab Results  Component Value Date   ALT 24 06/29/2019   AST 25 06/29/2019   ALKPHOS 79 06/29/2019   BILITOT 0.6 06/29/2019    The 10-year ASCVD risk score Denman George DC Jr., et al., 2013) is: 5.1%   Values used to calculate the score:     Age: 32 years     Sex: Male     Is Non-Hispanic African American: Yes     Diabetic: No     Tobacco smoker: No     Systolic Blood Pressure: 124 mmHg     Is BP treated: Yes     HDL Cholesterol: 52 mg/dL     Total Cholesterol: 195 mg/dL   Cancer screening: No family history of colon cancer.  Father with stomach and prostate cancer.  After r/b discussion of testing, requests PSA only with FH of prostate CA. Limitations without DRE discussed.   STI screening: Requests testing, no new partners, no unprotected intercourse.   Immunization History  Administered Date(s) Administered  . Hepatitis A 03/28/2007, 09/30/2007  . Hepatitis B 03/28/2007, 04/28/2007, 09/30/2007, 05/31/2014  . Hepatitis B, adult 05/31/2014  . Influenza Inj Mdck Quad Pf 09/19/2017  . Influenza Inj Mdck Quad With Preservative 12/20/2018  . Influenza Split 09/30/2013, 09/30/2014  . Influenza,inj,Quad PF,6+ Mos 12/05/2015,  12/06/2016, 10/05/2019  . Tdap 03/28/2007, 06/06/2017  plans on covid vaccine when available.  Depression screen Jerry Johnston 2/9 01/01/2020 06/29/2019 12/29/2018 06/24/2018 12/19/2017  Decreased Interest 0 0 0 0 0  Down, Depressed, Hopeless 0 0 0 0 0  PHQ - 2 Score 0 0 0 0 0    Hearing Screening   125Hz  250Hz  500Hz  1000Hz  2000Hz  3000Hz  4000Hz  6000Hz  8000Hz   Right ear:           Left ear:             Visual Acuity Screening   Right eye Left eye Both eyes  Without correction: 20/20 20/20 20/20   With correction:     last appt with provider in 11/2019. No concerns.   Dental: every 6 months, appt in December.  Exercise: once per week. Increased workload. Avoiding gym.   Wt Readings from Last 3 Encounters:  01/01/20 197 lb (89.4 kg)  12/29/18 197 lb 12.8 oz (89.7 kg)  06/24/18 195 lb (88.5 kg)    History Patient Active Problem List   Diagnosis Date Noted  . Essential hypertension 12/19/2017  . Drug allergy 03/31/2013   Past Medical History:  Diagnosis Date  . Hypertension    Past Surgical History:  Procedure Laterality Date  . WRIST SURGERY     Allergies  Allergen Reactions  .  Percocet [Oxycodone-Acetaminophen]     ? Reaction pt stated he had broken out during the time of taking med.   Prior to Admission medications   Medication Sig Start Date End Date Taking? Authorizing Provider  lisinopril (ZESTRIL) 20 MG tablet TAKE 1 TABLET BY MOUTH EVERY DAY. 09/12/19  Yes [name], MD   Social History   Socioeconomic History  . Marital status: Single    Spouse name: Not on file  . Number of children: Not on file  . Years of education: Not on file  . Highest education level: Not on file  Occupational History  . Occupation: Psychiatric nurse  Tobacco Use  . Smoking status: Never Smoker  . Smokeless tobacco: Never Used  Substance and Sexual Activity  . Alcohol use: Yes    Alcohol/week: 2.0 - 3.0 standard drinks    Types: 2 - 3 Standard drinks or equivalent per week  . Drug use:  No  . Sexual activity: Yes    Birth control/protection: Condom  Other Topics Concern  . Not on file  Social History Narrative   Single. Education: [employer].   Exercise: Yes   Social Determinants of Health   Financial Resource Strain:   . Difficulty of Paying Living Expenses: Not on file  Food Insecurity:   . Worried About Programme researcher, broadcasting/film/video in the Last Year: Not on file  . Ran Out of Food in the Last Year: Not on file  Transportation Needs:   . Lack of Transportation (Medical): Not on file  . Lack of Transportation (Non-Medical): Not on file  Physical Activity:   . Days of Exercise per Week: Not on file  . Minutes of Exercise per Session: Not on file  Stress:   . Feeling of Stress : Not on file  Social Connections:   . Frequency of Communication with Friends and Family: Not on file  . Frequency of Social Gatherings with Friends and Family: Not on file  . Attends Religious Services: Not on file  . Active Member of Clubs or Organizations: Not on file  . Attends Banker Meetings: Not on file  . Marital Status: Not on file  Intimate Partner Violence:   . Fear of Current or Ex-Partner: Not on file  . Emotionally Abused: Not on file  . Physically Abused: Not on file  . Sexually Abused: Not on file    Review of Systems 13 point review of systems per patient health survey noted.  Negative other than as indicated above or in HPI.    Objective:   Vitals:   01/01/20 0853  BP: 124/82  Pulse: 70  Temp: 98.5 F (36.9 C)  TempSrc: Temporal  SpO2: 98%  Weight: 197 lb (89.4 kg)  Height: 6\' 1"  (1.854 m)     Physical Exam Vitals reviewed.  Constitutional:      Appearance: He is well-developed.  HENT:     Head: Normocephalic and atraumatic.     Right Ear: External ear normal.     Left Ear: External ear normal.  Eyes:     Conjunctiva/sclera: Conjunctivae normal.     Pupils: Pupils are equal, round, and reactive to light.  Neck:     Thyroid: No thyromegaly.   Cardiovascular:     Rate and Rhythm: Normal rate and regular rhythm.     Heart sounds: Normal heart sounds.  Pulmonary:     Effort: Pulmonary effort is normal. No respiratory distress.     Breath sounds: Normal breath  sounds. No wheezing.  Abdominal:     General: There is no distension.     Palpations: Abdomen is soft.     Tenderness: There is no abdominal tenderness.  Musculoskeletal:        General: No tenderness. Normal range of motion.     Cervical back: Normal range of motion and neck supple.  Lymphadenopathy:     Cervical: No cervical adenopathy.  Skin:    General: Skin is warm and dry.  Neurological:     Mental Status: He is alert and oriented to person, place, and time.     Deep Tendon Reflexes: Reflexes are normal and symmetric.  Psychiatric:        Behavior: Behavior normal.        Assessment & Plan:  Jerry Johnston is a 44 y.o. male . Annual physical exam - Plan: TSH, Lipid panel, Comprehensive metabolic panel  - -anticipatory guidance as below in AVS, screening labs above. Health maintenance items as above in HPI discussed/recommended as applicable.   Essential hypertension - Plan: lisinopril (ZESTRIL) 20 MG tablet  -Stable, continue same regimen.  Hyperlipidemia, unspecified hyperlipidemia type  -Mild elevation previously, but low 10-year ASCVD risk score.  Repeat labs  Routine screening for STI (sexually transmitted infection) - Plan: RPR, HIV antibody, GC/Chlamydia probe amp ([place])not at [facility]  Screening for malignant neoplasm of prostate Family history of prostate cancer - Plan: PSA  - We discussed pros and cons of prostate cancer screening, and after this discussion, he chose to have screening done with PSA only given family history.  Limitations without DRE discussed.  Meds ordered this encounter  Medications  . lisinopril (ZESTRIL) 20 MG tablet    Sig: TAKE 1 TABLET BY MOUTH EVERY DAY.    Dispense:  90 tablet    Refill:  2   Patient  Instructions       If you have lab work done today you will be contacted with your lab results within the next 2 weeks.  If you have not heard from [place] then please contact us. The fastest way to get your results is to register for My Chart.   IF you received an x-ray today, you will receive an invoice from [facility]. Please contact [facility] at [phone] with questions or concerns regarding your invoice.   IF you received labwork today, you will receive an invoice from [place]. Please contact [facility] at [phone] with questions or concerns regarding your invoice.   Our billing staff will not be able to assist you with questions regarding bills from these companies.  You will be contacted with the lab results as soon as they are available. The fastest way to get your results is to activate your My Chart account. Instructions are located on the last page of this paperwork. If you have not heard from [place] regarding the results in 2 weeks, please contact this office.         Signed, Merri Ray, MD [facility]

## 2020-01-01 NOTE — Patient Instructions (Addendum)
No medication changes at this time.  I will recheck cholesterol but it was only mildly elevated in the past.  I am checking STI test as well as prostate test.  Try to incorporate exercise best as possible into the workday, but I do agree with avoiding from the gym at this time. Thanks for coming in today and let me know if there are questions.   Keeping you healthy  Get these tests  Blood pressure- Have your blood pressure checked once a year by your healthcare provider.  Normal blood pressure is 120/80.  Weight- Have your body mass index (BMI) calculated to screen for obesity.  BMI is a measure of body fat based on height and weight. You can also calculate your own BMI at [employer].it.  Cholesterol- Have your cholesterol checked regularly starting at age 51, sooner may be necessary if you have diabetes, high blood pressure, if a family member developed heart diseases at an early age or if you smoke.   Chlamydia, HIV, and other sexual transmitted disease- Get screened each year until the age of 46 then within three months of each new sexual partner.  Diabetes- Have your blood sugar checked regularly if you have high blood pressure, high cholesterol, a family history of diabetes or if you are overweight.  Get these vaccines  Flu shot- Every fall.  Tetanus shot- Every 10 years.  Menactra- Single dose; prevents meningitis.  Take these steps  Don't smoke- If you do smoke, ask your healthcare provider about quitting. For tips on how to quit, go to www.smokefree.gov or call 1-800-QUIT-NOW.  Be physically active- Exercise 5 days a week for at least 30 minutes.  If you are not already physically active start slow and gradually work up to 30 minutes of moderate physical activity.  Examples of moderate activity include walking briskly, mowing the yard, dancing, swimming bicycling, etc.  Eat a healthy diet- Eat a variety of healthy foods such as fruits, vegetables, low fat milk, low  fat cheese, yogurt, lean meats, poultry, fish, beans, tofu, etc.  For more information on healthy eating, go to www.thenutritionsource.org  Drink alcohol in moderation- Limit alcohol intake two drinks or less a day.  Never drink and drive.  Dentist- Brush and floss teeth twice daily; visit your dentis twice a year.  Depression-Your emotional health is as important as your physical health.  If you're feeling down, losing interest in things you normally enjoy please talk with your healthcare provider.  Gun Safety- If you keep a gun in your home, keep it unloaded and with the safety lock on.  Bullets should be stored separately.  Helmet use- Always wear a helmet when riding a motorcycle, bicycle, rollerblading or skateboarding.  Safe sex- If you may be exposed to a sexually transmitted infection, use a condom  Seat belts- Seat bels can save your life; always wear one.  Smoke/Carbon Monoxide detectors- These detectors need to be installed on the appropriate level of your home.  Replace batteries at least once a year.  Skin Cancer- When out in the sun, cover up and use sunscreen SPF 15 or higher.  Violence- If anyone is threatening or hurting you, please tell your healthcare provider.   If you have lab work done today you will be contacted with your lab results within the next 2 weeks.  If you have not heard from [place] then please contact us. The fastest way to get your results is to register for My Chart.   IF you  received an x-ray today, you will receive an invoice from [facility]. Please contact [facility] at [phone] with questions or concerns regarding your invoice.   IF you received labwork today, you will receive an invoice from [place]. Please contact [facility] at [phone] with questions or concerns regarding your invoice.   Our billing staff will not be able to assist you with questions regarding bills from these companies.  You will be contacted with the  lab results as soon as they are available. The fastest way to get your results is to activate your My Chart account. Instructions are located on the last page of this paperwork. If you have not heard from [place] regarding the results in 2 weeks, please contact this office.

## 2020-01-02 LAB — COMPREHENSIVE METABOLIC PANEL
ALT: 28 IU/L (ref 0–44)
AST: 26 IU/L (ref 0–40)
Albumin/Globulin Ratio: 1.7 (ref 1.2–2.2)
Albumin: 4.8 g/dL (ref 4.0–5.0)
Alkaline Phosphatase: 87 IU/L (ref 39–117)
BUN/Creatinine Ratio: 10 (ref 9–20)
BUN: 12 mg/dL (ref 6–24)
Bilirubin Total: 0.5 mg/dL (ref 0.0–1.2)
CO2: 21 mmol/L (ref 20–29)
Calcium: 9.9 mg/dL (ref 8.7–10.2)
Chloride: 101 mmol/L (ref 96–106)
Creatinine, Ser: 1.24 mg/dL (ref 0.76–1.27)
GFR calc Af Amer: 83 mL/min/{1.73_m2} (ref >59)
GFR calc non Af Amer: 72 mL/min/{1.73_m2} (ref >59)
Globulin, Total: 2.9 g/dL (ref 1.5–4.5)
Glucose: 79 mg/dL (ref 65–99)
Potassium: 4.7 mmol/L (ref 3.5–5.2)
Sodium: 140 mmol/L (ref 134–144)
Total Protein: 7.7 g/dL (ref 6.0–8.5)

## 2020-01-02 LAB — LIPID PANEL
Chol/HDL Ratio: 4.2 ratio (ref 0.0–5.0)
Cholesterol, Total: 233 mg/dL — ABNORMAL HIGH (ref 100–199)
HDL: 56 mg/dL (ref >39)
LDL Chol Calc (NIH): 160 mg/dL — ABNORMAL HIGH (ref 0–99)
Triglycerides: 97 mg/dL (ref 0–149)
VLDL Cholesterol Cal: 17 mg/dL (ref 5–40)

## 2020-01-02 LAB — TSH: TSH: 1.02 u[IU]/mL (ref 0.450–4.500)

## 2020-01-02 LAB — HIV ANTIBODY (ROUTINE TESTING W REFLEX): HIV Screen 4th Generation wRfx: NONREACTIVE

## 2020-01-02 LAB — PSA: Prostate Specific Ag, Serum: 1.2 ng/mL (ref 0.0–4.0)

## 2020-01-02 LAB — RPR: RPR Ser Ql: NONREACTIVE

## 2020-01-06 LAB — GC/CHLAMYDIA PROBE AMP (~~LOC~~) NOT AT ARMC
Chlamydia: NEGATIVE
Comment: NEGATIVE
Comment: NORMAL
Neisseria Gonorrhea: NEGATIVE

## 2020-03-09 ENCOUNTER — Encounter: Payer: Self-pay | Admitting: Family Medicine

## 2020-07-01 ENCOUNTER — Ambulatory Visit: Payer: Managed Care, Other (non HMO) | Admitting: Family Medicine

## 2020-07-04 ENCOUNTER — Encounter: Payer: Self-pay | Admitting: Family Medicine

## 2020-07-04 ENCOUNTER — Other Ambulatory Visit: Payer: Self-pay

## 2020-07-04 ENCOUNTER — Ambulatory Visit (INDEPENDENT_AMBULATORY_CARE_PROVIDER_SITE_OTHER): Payer: Managed Care, Other (non HMO) | Admitting: Family Medicine

## 2020-07-04 VITALS — BP 126/75 | HR 67 | Temp 98.2°F | Ht 73.0 in | Wt 197.0 lb

## 2020-07-04 DIAGNOSIS — E785 Hyperlipidemia, unspecified: Secondary | ICD-10-CM

## 2020-07-04 DIAGNOSIS — I10 Essential (primary) hypertension: Secondary | ICD-10-CM

## 2020-07-04 MED ORDER — LISINOPRIL 20 MG PO TABS: ORAL_TABLET | ORAL | 2 refills | Status: DC

## 2020-07-04 NOTE — Progress Notes (Signed)
Subjective:  Patient ID: Jerry Johnston, male    DOB: 25-Jun-1976  Age: 44 y.o. MRN: [id number]  CC:  Chief Complaint  Patient presents with  . Follow-up    on hypertension and hyperlipidemia. Pt reports no issues with BP since last OV, but pt isn't checking his BP offten Pt states trying to check BP 2x a week. Pt has getten light headed a few times since last OV, but no other physical symptoms of these conditions.    HPI Jerry Johnston presents for   Hypertension: Lisinopril 20 mg daily.  Few episodes of lightheadedness since last visit - no change. Only if sitting for awhile and standing up quickly. Every few weeks only.  No syncope.  Home readings:120-130/70-80  No new side effects with meds.  BP Readings from Last 3 Encounters:  07/04/20 126/75  01/01/20 124/82  12/29/18 118/79   Lab Results  Component Value Date   CREATININE 1.24 01/01/2020    Hyperlipidemia: Not on statin, relatively low ASCVD risk score previously, diet/exercise approach planned. Gym membership still on pause.   Wt Readings from Last 3 Encounters:  07/04/20 197 lb (89.4 kg)  01/01/20 197 lb (89.4 kg)  12/29/18 197 lb 12.8 oz (89.7 kg)    The 10-year ASCVD risk score Denman George DC Jr., et al., 2013) is: 5.7%   Values used to calculate the score:     Age: 75 years     Sex: Male     Is Non-Hispanic African American: Yes     Diabetic: No     Tobacco smoker: No     Systolic Blood Pressure: 126 mmHg     Is BP treated: Yes     HDL Cholesterol: 56 mg/dL     Total Cholesterol: 233 mg/dL  Lab Results  Component Value Date   CHOL 233 (H) 01/01/2020   HDL 56 01/01/2020   LDLCALC 160 (H) 01/01/2020   TRIG 97 01/01/2020   CHOLHDL 4.2 01/01/2020   Lab Results  Component Value Date   ALT 28 01/01/2020   AST 26 01/01/2020   ALKPHOS 87 01/01/2020   BILITOT 0.5 01/01/2020    History Patient Active Problem List   Diagnosis Date Noted  . Essential hypertension 12/19/2017  . Drug allergy  03/31/2013   Past Medical History:  Diagnosis Date  . Hypertension    Past Surgical History:  Procedure Laterality Date  . WRIST SURGERY     Allergies  Allergen Reactions  . Percocet [Oxycodone-Acetaminophen]     ? Reaction pt stated he had broken out during the time of taking med.   Prior to Admission medications   Medication Sig Start Date End Date Taking? Authorizing Provider  lisinopril (ZESTRIL) 20 MG tablet TAKE 1 TABLET BY MOUTH EVERY DAY. 01/01/20  Yes [name], MD   Social History   Socioeconomic History  . Marital status: Single    Spouse name: Not on file  . Number of children: Not on file  . Years of education: Not on file  . Highest education level: Not on file  Occupational History  . Occupation: Psychiatric nurse  Tobacco Use  . Smoking status: Never Smoker  . Smokeless tobacco: Never Used  Substance and Sexual Activity  . Alcohol use: Yes    Alcohol/week: 2.0 - 3.0 standard drinks    Types: 2 - 3 Standard drinks or equivalent per week  . Drug use: No  . Sexual activity: Yes    Birth control/protection: Condom  Other Topics Concern  . Not on file  Social History Narrative   Single. Education: [employer].   Exercise: Yes   Social Determinants of Health   Financial Resource Strain:   . Difficulty of Paying Living Expenses:   Food Insecurity:   . Worried About Programme researcher, broadcasting/film/video in the Last Year:   . Barista in the Last Year:   Transportation Needs:   . Freight forwarder (Medical):   Marland Kitchen Lack of Transportation (Non-Medical):   Physical Activity:   . Days of Exercise per Week:   . Minutes of Exercise per Session:   Stress:   . Feeling of Stress :   Social Connections:   . Frequency of Communication with Friends and Family:   . Frequency of Social Gatherings with Friends and Family:   . Attends Religious Services:   . Active Member of Clubs or Organizations:   . Attends Banker Meetings:   Marland Kitchen Marital Status:   Intimate  Partner Violence:   . Fear of Current or Ex-Partner:   . Emotionally Abused:   Marland Kitchen Physically Abused:   . Sexually Abused:     Review of Systems  Constitutional: Negative for fatigue and unexpected weight change.  Eyes: Negative for visual disturbance.  Respiratory: Negative for cough, chest tightness and shortness of breath.   Cardiovascular: Negative for chest pain, palpitations and leg swelling.  Gastrointestinal: Negative for abdominal pain and blood in stool.  Neurological: Positive for light-headedness (few rare episodes. ). Negative for dizziness and headaches.     Objective:   Vitals:   07/04/20 0854  BP: 126/75  Pulse: 67  Temp: 98.2 F (36.8 C)  TempSrc: Temporal  SpO2: 99%  Weight: 197 lb (89.4 kg)  Height: 6\' 1"  (1.854 m)     Physical Exam Vitals reviewed.  Constitutional:      Appearance: He is well-developed.  HENT:     Head: Normocephalic and atraumatic.  Eyes:     Pupils: Pupils are equal, round, and reactive to light.  Neck:     Vascular: No carotid bruit or JVD.  Cardiovascular:     Rate and Rhythm: Normal rate and regular rhythm.     Heart sounds: Normal heart sounds. No murmur heard.   Pulmonary:     Effort: Pulmonary effort is normal.     Breath sounds: Normal breath sounds. No rales.  Skin:    General: Skin is warm and dry.  Neurological:     Mental Status: He is alert and oriented to person, place, and time.        Assessment & Plan:  Jerry Johnston is a 44 y.o. male . Hyperlipidemia, unspecified hyperlipidemia type - Plan: Lipid panel, Comprehensive metabolic panel  -Overall low 10-year ASCVD risk were previously.  Adjusted exercise too much recently.  We did discuss the fact that he has been vaccinated against COVID-19 and outdoor exercise would be relatively safe, even indoor exercise with option of mask discussed.  Repeat testing.  Essential hypertension - Plan: Lipid panel, Comprehensive metabolic panel, lisinopril (ZESTRIL)  20 MG tablet  Controlled.  Rare lightheadedness, increase fluid intake, RTC precautions if persistent.  Check labs.  Continue lisinopril 20 mg daily.  Meds ordered this encounter  Medications  . lisinopril (ZESTRIL) 20 MG tablet    Sig: TAKE 1 TABLET BY MOUTH EVERY DAY.    Dispense:  90 tablet    Refill:  2   Patient Instructions  No change in lisinopril dose for now.  Make sure to drink plenty of fluids throughout the day, especially when it is hot outside.  If persistent lightheadedness or that becomes more frequent, follow-up to discuss further.  I will check your cholesterol levels today, but based on your last testing would not necessarily recommend a medication.  Recheck in 6 months but let me know if there are questions sooner.  Take care.   If you have lab work done today you will be contacted with your lab results within the next 2 weeks.  If you have not heard from [place] then please contact us. The fastest way to get your results is to register for My Chart.   IF you received an x-ray today, you will receive an invoice from [facility]. Please contact [facility] at [phone] with questions or concerns regarding your invoice.   IF you received labwork today, you will receive an invoice from [place]. Please contact [facility] at [phone] with questions or concerns regarding your invoice.   Our billing staff will not be able to assist you with questions regarding bills from these companies.  You will be contacted with the lab results as soon as they are available. The fastest way to get your results is to activate your My Chart account. Instructions are located on the last page of this paperwork. If you have not heard from [place] regarding the results in 2 weeks, please contact this office.         Signed, [name], MD [facility]

## 2020-07-04 NOTE — Patient Instructions (Addendum)
  No change in lisinopril dose for now.  Make sure to drink plenty of fluids throughout the day, especially when it is hot outside.  If persistent lightheadedness or that becomes more frequent, follow-up to discuss further.  I will check your cholesterol levels today, but based on your last testing would not necessarily recommend a medication.  Recheck in 6 months but let me know if there are questions sooner.  Take care.   If you have lab work done today you will be contacted with your lab results within the next 2 weeks.  If you have not heard from [place] then please contact us. The fastest way to get your results is to register for My Chart.   IF you received an x-ray today, you will receive an invoice from [facility]. Please contact [facility] at [phone] with questions or concerns regarding your invoice.   IF you received labwork today, you will receive an invoice from [place]. Please contact [facility] at [phone] with questions or concerns regarding your invoice.   Our billing staff will not be able to assist you with questions regarding bills from these companies.  You will be contacted with the lab results as soon as they are available. The fastest way to get your results is to activate your My Chart account. Instructions are located on the last page of this paperwork. If you have not heard from [place] regarding the results in 2 weeks, please contact this office.

## 2020-07-05 LAB — COMPREHENSIVE METABOLIC PANEL
ALT: 28 IU/L (ref 0–44)
AST: 28 IU/L (ref 0–40)
Albumin/Globulin Ratio: 1.7 (ref 1.2–2.2)
Albumin: 4.6 g/dL (ref 4.0–5.0)
Alkaline Phosphatase: 75 IU/L (ref 48–121)
BUN/Creatinine Ratio: 9 (ref 9–20)
BUN: 10 mg/dL (ref 6–24)
Bilirubin Total: 0.4 mg/dL (ref 0.0–1.2)
CO2: 25 mmol/L (ref 20–29)
Calcium: 9.6 mg/dL (ref 8.7–10.2)
Chloride: 101 mmol/L (ref 96–106)
Creatinine, Ser: 1.12 mg/dL (ref 0.76–1.27)
GFR calc Af Amer: 93 mL/min/{1.73_m2} (ref >59)
GFR calc non Af Amer: 81 mL/min/{1.73_m2} (ref >59)
Globulin, Total: 2.7 g/dL (ref 1.5–4.5)
Glucose: 82 mg/dL (ref 65–99)
Potassium: 4.9 mmol/L (ref 3.5–5.2)
Sodium: 139 mmol/L (ref 134–144)
Total Protein: 7.3 g/dL (ref 6.0–8.5)

## 2020-07-05 LAB — LIPID PANEL
Chol/HDL Ratio: 4.6 ratio (ref 0.0–5.0)
Cholesterol, Total: 229 mg/dL — ABNORMAL HIGH (ref 100–199)
HDL: 50 mg/dL (ref >39)
LDL Chol Calc (NIH): 153 mg/dL — ABNORMAL HIGH (ref 0–99)
Triglycerides: 142 mg/dL (ref 0–149)
VLDL Cholesterol Cal: 26 mg/dL (ref 5–40)

## 2021-01-04 ENCOUNTER — Encounter: Payer: Self-pay | Admitting: Family Medicine

## 2021-01-04 ENCOUNTER — Other Ambulatory Visit: Payer: Self-pay

## 2021-01-04 ENCOUNTER — Ambulatory Visit (INDEPENDENT_AMBULATORY_CARE_PROVIDER_SITE_OTHER): Payer: Managed Care, Other (non HMO) | Admitting: Family Medicine

## 2021-01-04 VITALS — BP 134/80 | HR 80 | Temp 97.2°F | Ht 73.0 in | Wt 197.0 lb

## 2021-01-04 DIAGNOSIS — G4700 Insomnia, unspecified: Secondary | ICD-10-CM

## 2021-01-04 DIAGNOSIS — E785 Hyperlipidemia, unspecified: Secondary | ICD-10-CM

## 2021-01-04 DIAGNOSIS — Z131 Encounter for screening for diabetes mellitus: Secondary | ICD-10-CM | POA: Diagnosis not present

## 2021-01-04 DIAGNOSIS — I10 Essential (primary) hypertension: Secondary | ICD-10-CM | POA: Diagnosis not present

## 2021-01-04 DIAGNOSIS — Z125 Encounter for screening for malignant neoplasm of prostate: Secondary | ICD-10-CM

## 2021-01-04 DIAGNOSIS — Z0000 Encounter for general adult medical examination without abnormal findings: Secondary | ICD-10-CM

## 2021-01-04 DIAGNOSIS — Z1329 Encounter for screening for other suspected endocrine disorder: Secondary | ICD-10-CM

## 2021-01-04 DIAGNOSIS — Z0001 Encounter for general adult medical examination with abnormal findings: Secondary | ICD-10-CM

## 2021-01-04 DIAGNOSIS — F439 Reaction to severe stress, unspecified: Secondary | ICD-10-CM

## 2021-01-04 MED ORDER — HYDROXYZINE HCL 25 MG PO TABS: 12.5000 mg | ORAL_TABLET | Freq: Every evening | ORAL | 0 refills | Status: DC | PRN

## 2021-01-04 NOTE — Patient Instructions (Addendum)
Ok to continue zquil OR can try hydroxyzine low dose at night - watch for new side effects. If continued snoring, I do recommend a sleep study.  See info about stress below as that may be impacting sleep. Some form of exercise most days per week may also help.  Here are a few options for counseling:  [facility]:  [place] [phone]    Textbook of family medicine (9th ed., pp. [phone]). [place], [place]: [place].">  Stress, Adult Stress is a normal reaction to life events. Stress is what you feel when life demands more than you are used to, or more than you think you can handle. Some stress can be useful, such as studying for a test or meeting a deadline at work. Stress that occurs too often or for too long can cause problems. It can affect your emotional health and interfere with relationships and normal daily activities. Too much stress can weaken your body's defense system (immune system) and increase your risk for physical illness. If you already have a medical problem, stress can make it worse. What are the causes? All sorts of life events can cause stress. An event that causes stress for one person may not be stressful for another person. Major life events, whether positive or negative, commonly cause stress. Examples include:  Losing a job or starting a new job.  Losing a loved one.  Moving to a new town or home.  Getting married or divorced.  Having a baby.  Getting injured or sick. Less obvious life events can also cause stress, especially if they occur day after day or in combination with each other. Examples include:  Working long hours.  Driving in traffic.  Caring for children.  Being in debt.  Being in a difficult relationship. What are the signs or symptoms? Stress can cause emotional symptoms, including:  Anxiety. This is feeling worried, afraid, on edge, overwhelmed, or out of  control.  Anger, including irritation or impatience.  Depression. This is feeling sad, down, helpless, or guilty.  Trouble focusing, remembering, or making decisions. Stress can cause physical symptoms, including:  Aches and pains. These may affect your head, neck, back, stomach, or other areas of your body.  Tight muscles or a clenched jaw.  Low energy.  Trouble sleeping. Stress can cause unhealthy behaviors, including:  Eating to feel better (overeating) or skipping meals.  Working too much or putting off tasks.  Smoking, drinking alcohol, or using drugs to feel better. How is this diagnosed? Stress is diagnosed through an assessment by your health care provider. He or she may diagnose this condition based on:  Your symptoms and any stressful life events.  Your medical history.  Tests to rule out other causes of your symptoms. Depending on your condition, your health care provider may refer you to a specialist for further evaluation. How is this treated? Stress management techniques are the recommended treatment for stress. Medicine is not typically recommended for the treatment of stress. Techniques to reduce your reaction to stressful life events include:  Stress identification. Monitor yourself for symptoms of stress and identify what causes stress for you. These skills may help you to avoid or prepare for stressful events.  Time management. Set your priorities, keep a calendar of events, and learn to say no. Taking these actions can help you avoid making too many commitments. Techniques for coping with stress include:  Rethinking the problem. Try to think realistically about stressful events rather than ignoring  them or overreacting. Try to find the positives in a stressful situation rather than focusing on the negatives.  Exercise. Physical exercise can release both physical and emotional tension. The key is to find a form of exercise that you enjoy and do it  regularly.  Relaxation techniques. These relax the body and mind. The key is to find one or more that you enjoy and use the techniques regularly. Examples include: ? Meditation, deep breathing, or progressive relaxation techniques. ? Yoga or tai chi. ? Biofeedback, mindfulness techniques, or journaling. ? Listening to music, being out in nature, or participating in other hobbies.  Practicing a healthy lifestyle. Eat a balanced diet, drink plenty of water, limit or avoid caffeine, and get plenty of sleep.  Having a strong support network. Spend time with family, friends, or other people you enjoy being around. Express your feelings and talk things over with someone you trust. Counseling or talk therapy with a mental health professional may be helpful if you are having trouble managing stress on your own.   Follow these instructions at home: Lifestyle  Avoid drugs.  Do not use any products that contain nicotine or tobacco, such as cigarettes, e-cigarettes, and chewing tobacco. If you need help quitting, ask your health care provider.  Limit alcohol intake to no more than 1 drink a day for nonpregnant women and 2 drinks a day for men. One drink equals 12 oz of beer, 5 oz of wine, or 1 oz of hard liquor  Do not use alcohol or drugs to relax.  Eat a balanced diet that includes fresh fruits and vegetables, whole grains, lean meats, fish, eggs, and beans, and low-fat dairy. Avoid processed foods and foods high in added fat, sugar, and salt.  Exercise at least 30 minutes on 5 or more days each week.  Get 7-8 hours of sleep each night.   General instructions  Practice stress management techniques as discussed with your health care provider.  Drink enough fluid to keep your urine clear or pale yellow.  Take over-the-counter and prescription medicines only as told by your health care provider.  Keep all follow-up visits as told by your health care provider. This is important.   Contact a  health care provider if:  Your symptoms get worse.  You have new symptoms.  You feel overwhelmed by your problems and can no longer manage them on your own. Get help right away if:  You have thoughts of hurting yourself or others. If you ever feel like you may hurt yourself or others, or have thoughts about taking your own life, get help right away. You can go to your nearest emergency department or call:  Your local emergency services (911 in the [place]  A suicide crisis helpline, such as the [facility] at (680)206-1851. This is open 24 hours a day. Summary  Stress is a normal reaction to life events. It can cause problems if it happens too often or for too long.  Practicing stress management techniques is the best way to treat stress.  Counseling or talk therapy with a mental health professional may be helpful if you are having trouble managing stress on your own. This information is not intended to replace advice given to you by your health care provider. Make sure you discuss any questions you have with your health care provider. Document Revised: 08/19/2020 Document Reviewed: 08/19/2020 Elsevier Patient Education  2021 [place] you healthy  Get these tests  Blood pressure-  Have your blood pressure checked once a year by your healthcare provider.  Normal blood pressure is 120/80.  Weight- Have your body mass index (BMI) calculated to screen for obesity.  BMI is a measure of body fat based on height and weight. You can also calculate your own BMI at [employer].it.  Cholesterol- Have your cholesterol checked regularly starting at age 28, sooner may be necessary if you have diabetes, high blood pressure, if a family member developed heart diseases at an early age or if you smoke.   Chlamydia, HIV, and other sexual transmitted disease- Get screened each year until the age of 65 then within three months of each new sexual  partner.  Diabetes- Have your blood sugar checked regularly if you have high blood pressure, high cholesterol, a family history of diabetes or if you are overweight.  Get these vaccines  Flu shot- Every fall.  Tetanus shot- Every 10 years.  Menactra- Single dose; prevents meningitis.  Take these steps  Don't smoke- If you do smoke, ask your healthcare provider about quitting. For tips on how to quit, go to www.smokefree.gov or call 1-800-QUIT-NOW.  Be physically active- Exercise 5 days a week for at least 30 minutes.  If you are not already physically active start slow and gradually work up to 30 minutes of moderate physical activity.  Examples of moderate activity include walking briskly, mowing the yard, dancing, swimming bicycling, etc.  Eat a healthy diet- Eat a variety of healthy foods such as fruits, vegetables, low fat milk, low fat cheese, yogurt, lean meats, poultry, fish, beans, tofu, etc.  For more information on healthy eating, go to www.thenutritionsource.org  Drink alcohol in moderation- Limit alcohol intake two drinks or less a day.  Never drink and drive.  Dentist- Brush and floss teeth twice daily; visit your dentis twice a year.  Depression-Your emotional health is as important as your physical health.  If you're feeling down, losing interest in things you normally enjoy please talk with your healthcare provider.  Gun Safety- If you keep a gun in your home, keep it unloaded and with the safety lock on.  Bullets should be stored separately.  Helmet use- Always wear a helmet when riding a motorcycle, bicycle, rollerblading or skateboarding.  Safe sex- If you may be exposed to a sexually transmitted infection, use a condom  Seat belts- Seat bels can save your life; always wear one.  Smoke/Carbon Monoxide detectors- These detectors need to be installed on the appropriate level of your home.  Replace batteries at least once a year.  Skin Cancer- When out in the sun,  cover up and use sunscreen SPF 15 or higher.  Violence- If anyone is threatening or hurting you, please tell your healthcare provider.   If you have lab work done today you will be contacted with your lab results within the next 2 weeks.  If you have not heard from [place] then please contact us. The fastest way to get your results is to register for My Chart.   IF you received an x-ray today, you will receive an invoice from [facility]. Please contact [facility] at [phone] with questions or concerns regarding your invoice.   IF you received labwork today, you will receive an invoice from [place]. Please contact [facility] at [phone] with questions or concerns regarding your invoice.   Our billing staff will not be able to assist you with questions regarding bills from these companies.  You will be contacted with the lab  results as soon as they are available. The fastest way to get your results is to activate your My Chart account. Instructions are located on the last page of this paperwork. If you have not heard from [place] regarding the results in 2 weeks, please contact this office.

## 2021-01-04 NOTE — Progress Notes (Signed)
Subjective:  Patient ID: Jerry Johnston, male    DOB: 03/26/1976  Age: 45 y.o. MRN: [id number]  CC:  Chief Complaint  Patient presents with   Annual Exam    Pt reports he feels well, but has noticed for a while having an issue staying asleep. Pt will fall asleep easy, but wake up a few times a night.Pt reports getting 7-8 hour of sleep most night,but has a few days a week where he only gets 4-5 hours of sleep. Pt reports on these days he wakes up not feeling well rested, and will have a crash of energy later in the day. PT has brought home BP log with him for provider to review.    HPI Jerry Johnston presents for   Annual physical exam, other concerns as above.   Sleep difficulty Interrupted sleep few days per week, waking up few times, can be awake for few hrs. On these days he is not well rested, daytime fatigue.  most nights able to sleep 7 to 8 hours.  Falls asleep ok. Sleep hygiene discussed in past. Waking up for unknown reason. Nocturia - only once or none. When wakes up - trouble getting back to sleep. Snoring at times. No nocturnal dyspnea. No weight loss/night sweats.  Some increased stress with work. Remote work more difficult to have separation of work/home.  Some relationship issues as well.  Less exercise - walking at times, 2-3 times per week  but no gym workouts (prior 5-6 days per week). No home weights. Feeling down past few days.  Stress mgt - working on hobby.   tx: otc zquil, melatonin - work ok on these. Slight groggy with meds in am. Feels better after taking these.   Hypertension: Lisinopril 20 mg daily. No new side effects. No cough.  Home readings:110's-130's/70-80's.   BP Readings from Last 3 Encounters:  01/04/21 134/80  07/04/20 126/75  01/01/20 124/82   Lab Results  Component Value Date   CREATININE 1.12 07/04/2020   Hyperlipidemia: Mild with low ASCVD risk score, not currently on statin. Has improved diet recently, but just recent  change.  The 10-year ASCVD risk score Jerry Johnston., et al., 2013) is: 6.6%   Values used to calculate the score:     Age: 43 years     Sex: Male     Is Non-Hispanic African American: Yes     Diabetic: No     Tobacco smoker: No     Systolic Blood Pressure: 937 mmHg     Is BP treated: Yes     HDL Cholesterol: 50 mg/dL     Total Cholesterol: 229 mg/dL  Wt Readings from Last 3 Encounters:  01/04/21 197 lb (89.4 kg)  07/04/20 197 lb (89.4 kg)  01/01/20 197 lb (89.4 kg)   The natural history of prostate cancer and ongoing controversy regarding screening and potential treatment outcomes of prostate cancer has been discussed with the patient. The meaning of a false positive PSA and a false negative PSA has been discussed. He indicates understanding of the limitations of this screening test and wishes to proceed with screening PSA testing.   Lab Results  Component Value Date   CHOL 229 (H) 07/04/2020   HDL 50 07/04/2020   LDLCALC 153 (H) 07/04/2020   TRIG 142 07/04/2020   CHOLHDL 4.6 07/04/2020   Lab Results  Component Value Date   ALT 28 07/04/2020   AST 28 07/04/2020   ALKPHOS 75 07/04/2020  BILITOT 0.4 07/04/2020   Immunization History  Administered Date(s) Administered   Hepatitis A 03/28/2007, 09/30/2007   Hepatitis B 03/28/2007, 04/28/2007, 09/30/2007, 05/31/2014   Hepatitis B, adult 05/31/2014   Influenza Inj Mdck Quad Pf 09/19/2017   Influenza Inj Mdck Quad With Preservative 12/20/2018   Influenza Split 09/30/2013, 09/30/2014   Influenza,inj,Quad PF,6+ Mos 12/05/2015, 12/06/2016, 10/05/2019   PFIZER(Purple Top)SARS-COV-2 Vaccination 03/09/2020, 03/30/2020   Tdap 03/28/2007, 06/06/2017  covid booster: has not yet received. Plans on receiving soon.   STI screening:declined today.   Depression screen [facility] 2/9 01/04/2021 07/04/2020 01/01/2020 06/29/2019 12/29/2018  Decreased Interest 0 0 0 0 0  Down, Depressed, Hopeless 0 0 0 0 0  PHQ - 2 Score 0 0 0 0 0     Hearing Screening   '125Hz'  '250Hz'  '500Hz'  '1000Hz'  '2000Hz'  '3000Hz'  '4000Hz'  '6000Hz'  '8000Hz'   Right ear:           Left ear:             Visual Acuity Screening   Right eye Left eye Both eyes  Without correction: '20/13 20/15 20/13 '  With correction:     appt with optho next month.   Dental: every 27month - went yesterday.   Exercise: as above.   History Patient Active Problem List   Diagnosis Date Noted   Essential hypertension 12/19/2017   Drug allergy 03/31/2013   Past Medical History:  Diagnosis Date   Hypertension    Past Surgical History:  Procedure Laterality Date   WRIST SURGERY     Allergies  Allergen Reactions   Percocet [Oxycodone-Acetaminophen]     ? Reaction pt stated he had broken out during the time of taking med.   Prior to Admission medications   Medication Sig Start Date End Date Taking? Authorizing Provider  lisinopril (ZESTRIL) 20 MG tablet TAKE 1 TABLET BY MOUTH EVERY DAY. 07/04/20  Yes GWendie Agreste MD   Social History   Socioeconomic History   Marital status: Single    Spouse name: Not on file   Number of children: Not on file   Years of education: Not on file   Highest education level: Not on file  Occupational History   Occupation: IMaterials engineer Tobacco Use   Smoking status: Never Smoker   Smokeless tobacco: Never Used  Substance and Sexual Activity   Alcohol use: Yes    Alcohol/week: 2.0 - 3.0 standard drinks    Types: 2 - 3 Standard drinks or equivalent per week   Drug use: No   Sexual activity: Yes    Birth control/protection: Condom  Other Topics Concern   Not on file  Social History Narrative   Single. Education: [employer]   Exercise: Yes   Social Determinants of Health   Financial Resource Strain: Not on file  Food Insecurity: Not on file  Transportation Needs: Not on file  Physical Activity: Not on file  Stress: Not on file  Social Connections: Not on file  Intimate Partner Violence: Not on file    Review of  Systems 13 point review of systems per patient health survey noted.  Negative other than as indicated above or in HPI.    Objective:   Vitals:   01/04/21 0824 01/04/21 0830  BP: (!) 146/83 134/80  Pulse: 80   Temp: (!) 97.2 F (36.2 C)   TempSrc: Temporal   SpO2: 98%   Weight: 197 lb (89.4 kg)   Height: '6\' 1"'  (1.854 m)      Physical  Exam Vitals reviewed.  Constitutional:      Appearance: He is well-developed.  HENT:     Head: Normocephalic and atraumatic.     Right Ear: External ear normal.     Left Ear: External ear normal.  Eyes:     Conjunctiva/sclera: Conjunctivae normal.     Pupils: Pupils are equal, round, and reactive to light.  Neck:     Thyroid: No thyromegaly.  Cardiovascular:     Rate and Rhythm: Normal rate and regular rhythm.     Heart sounds: Normal heart sounds.  Pulmonary:     Effort: Pulmonary effort is normal. No respiratory distress.     Breath sounds: Normal breath sounds. No wheezing.  Abdominal:     General: There is no distension.     Palpations: Abdomen is soft.     Tenderness: There is no abdominal tenderness.  Musculoskeletal:        General: No tenderness. Normal range of motion.     Cervical back: Normal range of motion and neck supple.  Lymphadenopathy:     Cervical: No cervical adenopathy.  Skin:    General: Skin is warm and dry.  Neurological:     Mental Status: He is alert and oriented to person, place, and time.     Deep Tendon Reflexes: Reflexes are normal and symmetric.  Psychiatric:        Mood and Affect: Mood normal.        Behavior: Behavior normal.        Thought Content: Thought content normal.        Assessment & Plan:  Jerry Johnston is a 45 y.o. male . Annual physical exam  - -anticipatory guidance as below in AVS, screening labs above. Health maintenance items as above in HPI discussed/recommended as applicable.   Essential hypertension - Plan: Comprehensive metabolic panel, Lipid panel  -  Stable,  tolerating current regimen. Medications refilled. Labs pending as above.   Hyperlipidemia, unspecified hyperlipidemia type - Plan: Comprehensive metabolic panel, Lipid panel  -Low ASCVD risk for previously, below the level to recommend statin.  Check labs.  Planned exercise  Screening for diabetes mellitus - Plan: Hemoglobin A1c  Insomnia, unspecified type - Plan: hydrOXYzine (ATARAX/VISTARIL) 25 MG tablet Situational stress  -Possible situational stress, trial of low-dose hydroxyzine or ZzzQuil discussed along with other sleep hygiene, stress management, option of counseling.  Numbers provided. RTC precautions.  -Consider sleep study if persistent snoring.  Screening for thyroid disorder - Plan: TSH   Screening for prostate cancer - Plan: PSA   No orders of the defined types were placed in this encounter.  Patient Instructions   Ok to continue zquil OR can try hydroxyzine low dose at night - watch for new side effects. If continued snoring, I do recommend a sleep study.  See info about stress below as that may be impacting sleep. Some form of exercise most days per week may also help.  Here are a few options for counseling:  [facility]:  [place] [phone]    Textbook of family medicine (9th ed., pp. [phone]). [place], [place]: [place].">  Stress, Adult Stress is a normal reaction to life events. Stress is what you feel when life demands more than you are used to, or more than you think you can handle. Some stress can be useful, such as studying for a test or meeting a deadline at work. Stress that occurs too often or for too long can cause  problems. It can affect your emotional health and interfere with relationships and normal daily activities. Too much stress can weaken your body's defense system (immune system) and increase your risk for physical illness. If you already have a medical problem, stress can make it  worse. What are the causes? All sorts of life events can cause stress. An event that causes stress for one person may not be stressful for another person. Major life events, whether positive or negative, commonly cause stress. Examples include:  Losing a job or starting a new job.  Losing a loved one.  Moving to a new town or home.  Getting married or divorced.  Having a baby.  Getting injured or sick. Less obvious life events can also cause stress, especially if they occur day after day or in combination with each other. Examples include:  Working long hours.  Driving in traffic.  Caring for children.  Being in debt.  Being in a difficult relationship. What are the signs or symptoms? Stress can cause emotional symptoms, including:  Anxiety. This is feeling worried, afraid, on edge, overwhelmed, or out of control.  Anger, including irritation or impatience.  Depression. This is feeling sad, down, helpless, or guilty.  Trouble focusing, remembering, or making decisions. Stress can cause physical symptoms, including:  Aches and pains. These may affect your head, neck, back, stomach, or other areas of your body.  Tight muscles or a clenched jaw.  Low energy.  Trouble sleeping. Stress can cause unhealthy behaviors, including:  Eating to feel better (overeating) or skipping meals.  Working too much or putting off tasks.  Smoking, drinking alcohol, or using drugs to feel better. How is this diagnosed? Stress is diagnosed through an assessment by your health care provider. He or she may diagnose this condition based on:  Your symptoms and any stressful life events.  Your medical history.  Tests to rule out other causes of your symptoms. Depending on your condition, your health care provider may refer you to a specialist for further evaluation. How is this treated? Stress management techniques are the recommended treatment for stress. Medicine is not typically  recommended for the treatment of stress. Techniques to reduce your reaction to stressful life events include:  Stress identification. Monitor yourself for symptoms of stress and identify what causes stress for you. These skills may help you to avoid or prepare for stressful events.  Time management. Set your priorities, keep a calendar of events, and learn to say no. Taking these actions can help you avoid making too many commitments. Techniques for coping with stress include:  Rethinking the problem. Try to think realistically about stressful events rather than ignoring them or overreacting. Try to find the positives in a stressful situation rather than focusing on the negatives.  Exercise. Physical exercise can release both physical and emotional tension. The key is to find a form of exercise that you enjoy and do it regularly.  Relaxation techniques. These relax the body and mind. The key is to find one or more that you enjoy and use the techniques regularly. Examples include: ? Meditation, deep breathing, or progressive relaxation techniques. ? Yoga or tai chi. ? Biofeedback, mindfulness techniques, or journaling. ? Listening to music, being out in nature, or participating in other hobbies.  Practicing a healthy lifestyle. Eat a balanced diet, drink plenty of water, limit or avoid caffeine, and get plenty of sleep.  Having a strong support network. Spend time with family, friends, or other people you enjoy being  around. Express your feelings and talk things over with someone you trust. Counseling or talk therapy with a mental health professional may be helpful if you are having trouble managing stress on your own.   Follow these instructions at home: Lifestyle  Avoid drugs.  Do not use any products that contain nicotine or tobacco, such as cigarettes, e-cigarettes, and chewing tobacco. If you need help quitting, ask your health care provider.  Limit alcohol intake to no more than 1  drink a day for nonpregnant women and 2 drinks a day for men. One drink equals 12 oz of beer, 5 oz of wine, or 1 oz of hard liquor  Do not use alcohol or drugs to relax.  Eat a balanced diet that includes fresh fruits and vegetables, whole grains, lean meats, fish, eggs, and beans, and low-fat dairy. Avoid processed foods and foods high in added fat, sugar, and salt.  Exercise at least 30 minutes on 5 or more days each week.  Get 7-8 hours of sleep each night.   General instructions  Practice stress management techniques as discussed with your health care provider.  Drink enough fluid to keep your urine clear or pale yellow.  Take over-the-counter and prescription medicines only as told by your health care provider.  Keep all follow-up visits as told by your health care provider. This is important.   Contact a health care provider if:  Your symptoms get worse.  You have new symptoms.  You feel overwhelmed by your problems and can no longer manage them on your own. Get help right away if:  You have thoughts of hurting yourself or others. If you ever feel like you may hurt yourself or others, or have thoughts about taking your own life, get help right away. You can go to your nearest emergency department or call:  Your local emergency services (911 in the [place]  A suicide crisis helpline, such as the [place] at 972-709-2826. This is open 24 hours a day. Summary  Stress is a normal reaction to life events. It can cause problems if it happens too often or for too long.  Practicing stress management techniques is the best way to treat stress.  Counseling or talk therapy with a mental health professional may be helpful if you are having trouble managing stress on your own. This information is not intended to replace advice given to you by your health care provider. Make sure you discuss any questions you have with your health care provider. Document  Revised: 08/19/2020 Document Reviewed: 08/19/2020 Elsevier Patient Education  2021 [place] you healthy  Get these tests  Blood pressure- Have your blood pressure checked once a year by your healthcare provider.  Normal blood pressure is 120/80.  Weight- Have your body mass index (BMI) calculated to screen for obesity.  BMI is a measure of body fat based on height and weight. You can also calculate your own BMI at [employer].it.  Cholesterol- Have your cholesterol checked regularly starting at age 62, sooner may be necessary if you have diabetes, high blood pressure, if a family member developed heart diseases at an early age or if you smoke.   Chlamydia, HIV, and other sexual transmitted disease- Get screened each year until the age of 69 then within three months of each new sexual partner.  Diabetes- Have your blood sugar checked regularly if you have high blood pressure, high cholesterol, a family history of diabetes or if you are overweight.  Get these vaccines  Flu shot- Every fall.  Tetanus shot- Every 10 years.  Menactra- Single dose; prevents meningitis.  Take these steps  Don't smoke- If you do smoke, ask your healthcare provider about quitting. For tips on how to quit, go to www.smokefree.gov or call 1-800-QUIT-NOW.  Be physically active- Exercise 5 days a week for at least 30 minutes.  If you are not already physically active start slow and gradually work up to 30 minutes of moderate physical activity.  Examples of moderate activity include walking briskly, mowing the yard, dancing, swimming bicycling, etc.  Eat a healthy diet- Eat a variety of healthy foods such as fruits, vegetables, low fat milk, low fat cheese, yogurt, lean meats, poultry, fish, beans, tofu, etc.  For more information on healthy eating, go to www.thenutritionsource.org  Drink alcohol in moderation- Limit alcohol intake two drinks or less a day.  Never drink and  drive.  Dentist- Brush and floss teeth twice daily; visit your dentis twice a year.  Depression-Your emotional health is as important as your physical health.  If you're feeling down, losing interest in things you normally enjoy please talk with your healthcare provider.  Gun Safety- If you keep a gun in your home, keep it unloaded and with the safety lock on.  Bullets should be stored separately.  Helmet use- Always wear a helmet when riding a motorcycle, bicycle, rollerblading or skateboarding.  Safe sex- If you may be exposed to a sexually transmitted infection, use a condom  Seat belts- Seat bels can save your life; always wear one.  Smoke/Carbon Monoxide detectors- These detectors need to be installed on the appropriate level of your home.  Replace batteries at least once a year.  Skin Cancer- When out in the sun, cover up and use sunscreen SPF 15 or higher.  Violence- If anyone is threatening or hurting you, please tell your healthcare provider.   If you have lab work done today you will be contacted with your lab results within the next 2 weeks.  If you have not heard from [place] then please contact us. The fastest way to get your results is to register for My Chart.   IF you received an x-ray today, you will receive an invoice from [facility]. Please contact [facility] at [phone] with questions or concerns regarding your invoice.   IF you received labwork today, you will receive an invoice from [place]. Please contact [facility] at [phone] with questions or concerns regarding your invoice.   Our billing staff will not be able to assist you with questions regarding bills from these companies.  You will be contacted with the lab results as soon as they are available. The fastest way to get your results is to activate your My Chart account. Instructions are located on the last page of this paperwork. If you have not heard from [place] regarding the results in 2  weeks, please contact this office.         Signed, Merri Ray, MD [facility]

## 2021-01-05 ENCOUNTER — Encounter: Payer: Self-pay | Admitting: Family Medicine

## 2021-01-05 LAB — COMPREHENSIVE METABOLIC PANEL
ALT: 31 IU/L (ref 0–44)
AST: 30 IU/L (ref 0–40)
Albumin/Globulin Ratio: 1.4 (ref 1.2–2.2)
Albumin: 4.7 g/dL (ref 4.0–5.0)
Alkaline Phosphatase: 82 IU/L (ref 44–121)
BUN/Creatinine Ratio: 8 — ABNORMAL LOW (ref 9–20)
BUN: 10 mg/dL (ref 6–24)
Bilirubin Total: 0.6 mg/dL (ref 0.0–1.2)
CO2: 22 mmol/L (ref 20–29)
Calcium: 9.9 mg/dL (ref 8.7–10.2)
Chloride: 100 mmol/L (ref 96–106)
Creatinine, Ser: 1.29 mg/dL — ABNORMAL HIGH (ref 0.76–1.27)
GFR calc Af Amer: 79 mL/min/{1.73_m2} (ref >59)
GFR calc non Af Amer: 68 mL/min/{1.73_m2} (ref >59)
Globulin, Total: 3.3 g/dL (ref 1.5–4.5)
Glucose: 86 mg/dL (ref 65–99)
Potassium: 4.6 mmol/L (ref 3.5–5.2)
Sodium: 138 mmol/L (ref 134–144)
Total Protein: 8 g/dL (ref 6.0–8.5)

## 2021-01-05 LAB — HEMOGLOBIN A1C
Est. average glucose Bld gHb Est-mCnc: 117 mg/dL
Hgb A1c MFr Bld: 5.7 % — ABNORMAL HIGH (ref 4.8–5.6)

## 2021-01-05 LAB — LIPID PANEL
Chol/HDL Ratio: 4.1 ratio (ref 0.0–5.0)
Cholesterol, Total: 232 mg/dL — ABNORMAL HIGH (ref 100–199)
HDL: 56 mg/dL (ref >39)
LDL Chol Calc (NIH): 161 mg/dL — ABNORMAL HIGH (ref 0–99)
Triglycerides: 88 mg/dL (ref 0–149)
VLDL Cholesterol Cal: 15 mg/dL (ref 5–40)

## 2021-01-05 LAB — PSA: Prostate Specific Ag, Serum: 1.8 ng/mL (ref 0.0–4.0)

## 2021-01-05 LAB — TSH: TSH: 1.63 u[IU]/mL (ref 0.450–4.500)

## 2021-01-05 MED ORDER — LISINOPRIL 20 MG PO TABS: ORAL_TABLET | ORAL | 2 refills | Status: DC

## 2021-01-26 ENCOUNTER — Other Ambulatory Visit: Payer: Self-pay | Admitting: Family Medicine

## 2021-01-26 DIAGNOSIS — G4700 Insomnia, unspecified: Secondary | ICD-10-CM

## 2021-01-26 NOTE — Telephone Encounter (Signed)
   Notes to clinic:  requesting 90 day supply   Requested Prescriptions  Pending Prescriptions Disp Refills   hydrOXYzine (ATARAX/VISTARIL) 25 MG tablet [Pharmacy Med Name: HYDROXYZINE HCL 25 MG TABLET] 90 tablet 1    Sig: Take 0.5-1 tablets (12.5-25 mg total) by mouth at bedtime as needed.      Ear, Nose, and Throat:  Antihistamines Passed - 01/26/2021 11:30 AM      Passed - Valid encounter within last 12 months    Recent Outpatient Visits           3 weeks ago Annual physical exam   [facility], Asencion Partridge, MD   6 months ago Hyperlipidemia, unspecified hyperlipidemia type   [facility], Asencion Partridge, MD   1 year ago Annual physical exam   [facility], Asencion Partridge, MD   1 year ago Hyperlipidemia, unspecified hyperlipidemia type   [facility], Asencion Partridge, MD   2 years ago Annual physical exam   [facility], Asencion Partridge, MD       Future Appointments             In 6 days [name], MD [facility] [place], [facility]

## 2021-02-01 ENCOUNTER — Ambulatory Visit (INDEPENDENT_AMBULATORY_CARE_PROVIDER_SITE_OTHER): Payer: Managed Care, Other (non HMO) | Admitting: Family Medicine

## 2021-02-01 ENCOUNTER — Other Ambulatory Visit: Payer: Self-pay

## 2021-02-01 ENCOUNTER — Encounter: Payer: Self-pay | Admitting: Family Medicine

## 2021-02-01 VITALS — BP 125/80 | HR 72 | Temp 98.4°F | Ht 73.0 in | Wt 198.0 lb

## 2021-02-01 DIAGNOSIS — G4700 Insomnia, unspecified: Secondary | ICD-10-CM

## 2021-02-01 DIAGNOSIS — F439 Reaction to severe stress, unspecified: Secondary | ICD-10-CM

## 2021-02-01 NOTE — Patient Instructions (Addendum)
If improving, ok to continue same plan for sleep at this time. Lowest effective dose of hydroxyzine.  Try to incorporate some form of activity or exercise during the day most days per week.  Meeting with therapist may be helpful if persistent stress, but see info below to see if that may be helpful as well.   Insomnia Insomnia is a sleep disorder that makes it difficult to fall asleep or stay asleep. Insomnia can cause fatigue, low energy, difficulty concentrating, mood swings, and poor performance at work or school. There are three different ways to classify insomnia:  Difficulty falling asleep.  Difficulty staying asleep.  Waking up too early in the morning. Any type of insomnia can be long-term (chronic) or short-term (acute). Both are common. Short-term insomnia usually lasts for three months or less. Chronic insomnia occurs at least three times a week for longer than three months. What are the causes? Insomnia may be caused by another condition, situation, or substance, such as:  Anxiety.  Certain medicines.  Gastroesophageal reflux disease (GERD) or other gastrointestinal conditions.  Asthma or other breathing conditions.  Restless legs syndrome, sleep apnea, or other sleep disorders.  Chronic pain.  Menopause.  Stroke.  Abuse of alcohol, tobacco, or illegal drugs.  Mental health conditions, such as depression.  Caffeine.  Neurological disorders, such as Alzheimer's disease.  An overactive thyroid (hyperthyroidism). Sometimes, the cause of insomnia may not be known. What increases the risk? Risk factors for insomnia include:  Gender. Women are affected more often than men.  Age. Insomnia is more common as you get older.  Stress.  Lack of exercise.  Irregular work schedule or working night shifts.  Traveling between different time zones.  Certain medical and mental health conditions. What are the signs or symptoms? If you have insomnia, the main  symptom is having trouble falling asleep or having trouble staying asleep. This may lead to other symptoms, such as:  Feeling fatigued or having low energy.  Feeling nervous about going to sleep.  Not feeling rested in the morning.  Having trouble concentrating.  Feeling irritable, anxious, or depressed. How is this diagnosed? This condition may be diagnosed based on:  Your symptoms and medical history. Your health care provider may ask about: ? Your sleep habits. ? Any medical conditions you have. ? Your mental health.  A physical exam. How is this treated? Treatment for insomnia depends on the cause. Treatment may focus on treating an underlying condition that is causing insomnia. Treatment may also include:  Medicines to help you sleep.  Counseling or therapy.  Lifestyle adjustments to help you sleep better. Follow these instructions at home: Eating and drinking  Limit or avoid alcohol, caffeinated beverages, and cigarettes, especially close to bedtime. These can disrupt your sleep.  Do not eat a large meal or eat spicy foods right before bedtime. This can lead to digestive discomfort that can make it hard for you to sleep.   Sleep habits  Keep a sleep diary to help you and your health care provider figure out what could be causing your insomnia. Write down: ? When you sleep. ? When you wake up during the night. ? How well you sleep. ? How rested you feel the next day. ? Any side effects of medicines you are taking. ? What you eat and drink.  Make your bedroom a dark, comfortable place where it is easy to fall asleep. ? Put up shades or blackout curtains to block light from outside. ? Use  a white noise machine to block noise. ? Keep the temperature cool.  Limit screen use before bedtime. This includes: ? Watching TV. ? Using your smartphone, tablet, or computer.  Stick to a routine that includes going to bed and waking up at the same times every day and night.  This can help you fall asleep faster. Consider making a quiet activity, such as reading, part of your nighttime routine.  Try to avoid taking naps during the day so that you sleep better at night.  Get out of bed if you are still awake after 15 minutes of trying to sleep. Keep the lights down, but try reading or doing a quiet activity. When you feel sleepy, go back to bed.   General instructions  Take over-the-counter and prescription medicines only as told by your health care provider.  Exercise regularly, as told by your health care provider. Avoid exercise starting several hours before bedtime.  Use relaxation techniques to manage stress. Ask your health care provider to suggest some techniques that may work well for you. These may include: ? Breathing exercises. ? Routines to release muscle tension. ? Visualizing peaceful scenes.  Make sure that you drive carefully. Avoid driving if you feel very sleepy.  Keep all follow-up visits as told by your health care provider. This is important. Contact a health care provider if:  You are tired throughout the day.  You have trouble in your daily routine due to sleepiness.  You continue to have sleep problems, or your sleep problems get worse. Get help right away if:  You have serious thoughts about hurting yourself or someone else. If you ever feel like you may hurt yourself or others, or have thoughts about taking your own life, get help right away. You can go to your nearest emergency department or call:  Your local emergency services (911 in the [place]  A suicide crisis helpline, such as the [facility] at 641-103-8475. This is open 24 hours a day. Summary  Insomnia is a sleep disorder that makes it difficult to fall asleep or stay asleep.  Insomnia can be long-term (chronic) or short-term (acute).  Treatment for insomnia depends on the cause. Treatment may focus on treating an underlying condition  that is causing insomnia.  Keep a sleep diary to help you and your health care provider figure out what could be causing your insomnia. This information is not intended to replace advice given to you by your health care provider. Make sure you discuss any questions you have with your health care provider. Document Revised: 10/13/2020 Document Reviewed: 10/13/2020 Elsevier Patient Education  2021 [employer] (9th ed., pp. [phone]). [place], [place]: [employer].">  Stress, Adult Stress is a normal reaction to life events. Stress is what you feel when life demands more than you are used to, or more than you think you can handle. Some stress can be useful, such as studying for a test or meeting a deadline at work. Stress that occurs too often or for too long can cause problems. It can affect your emotional health and interfere with relationships and normal daily activities. Too much stress can weaken your body's defense system (immune system) and increase your risk for physical illness. If you already have a medical problem, stress can make it worse. What are the causes? All sorts of life events can cause stress. An event that causes stress for one person may not be stressful for another person. Major life  events, whether positive or negative, commonly cause stress. Examples include:  Losing a job or starting a new job.  Losing a loved one.  Moving to a new town or home.  Getting married or divorced.  Having a baby.  Getting injured or sick. Less obvious life events can also cause stress, especially if they occur day after day or in combination with each other. Examples include:  Working long hours.  Driving in traffic.  Caring for children.  Being in debt.  Being in a difficult relationship. What are the signs or symptoms? Stress can cause emotional symptoms, including:  Anxiety. This is feeling worried, afraid, on edge, overwhelmed, or out of  control.  Anger, including irritation or impatience.  Depression. This is feeling sad, down, helpless, or guilty.  Trouble focusing, remembering, or making decisions. Stress can cause physical symptoms, including:  Aches and pains. These may affect your head, neck, back, stomach, or other areas of your body.  Tight muscles or a clenched jaw.  Low energy.  Trouble sleeping. Stress can cause unhealthy behaviors, including:  Eating to feel better (overeating) or skipping meals.  Working too much or putting off tasks.  Smoking, drinking alcohol, or using drugs to feel better. How is this diagnosed? Stress is diagnosed through an assessment by your health care provider. He or she may diagnose this condition based on:  Your symptoms and any stressful life events.  Your medical history.  Tests to rule out other causes of your symptoms. Depending on your condition, your health care provider may refer you to a specialist for further evaluation. How is this treated? Stress management techniques are the recommended treatment for stress. Medicine is not typically recommended for the treatment of stress. Techniques to reduce your reaction to stressful life events include:  Stress identification. Monitor yourself for symptoms of stress and identify what causes stress for you. These skills may help you to avoid or prepare for stressful events.  Time management. Set your priorities, keep a calendar of events, and learn to say no. Taking these actions can help you avoid making too many commitments. Techniques for coping with stress include:  Rethinking the problem. Try to think realistically about stressful events rather than ignoring them or overreacting. Try to find the positives in a stressful situation rather than focusing on the negatives.  Exercise. Physical exercise can release both physical and emotional tension. The key is to find a form of exercise that you enjoy and do it  regularly.  Relaxation techniques. These relax the body and mind. The key is to find one or more that you enjoy and use the techniques regularly. Examples include: ? Meditation, deep breathing, or progressive relaxation techniques. ? Yoga or tai chi. ? Biofeedback, mindfulness techniques, or journaling. ? Listening to music, being out in nature, or participating in other hobbies.  Practicing a healthy lifestyle. Eat a balanced diet, drink plenty of water, limit or avoid caffeine, and get plenty of sleep.  Having a strong support network. Spend time with family, friends, or other people you enjoy being around. Express your feelings and talk things over with someone you trust. Counseling or talk therapy with a mental health professional may be helpful if you are having trouble managing stress on your own.   Follow these instructions at home: Lifestyle  Avoid drugs.  Do not use any products that contain nicotine or tobacco, such as cigarettes, e-cigarettes, and chewing tobacco. If you need help quitting, ask your health care provider.  Limit alcohol intake to no more than 1 drink a day for nonpregnant women and 2 drinks a day for men. One drink equals 12 oz of beer, 5 oz of wine, or 1 oz of hard liquor  Do not use alcohol or drugs to relax.  Eat a balanced diet that includes fresh fruits and vegetables, whole grains, lean meats, fish, eggs, and beans, and low-fat dairy. Avoid processed foods and foods high in added fat, sugar, and salt.  Exercise at least 30 minutes on 5 or more days each week.  Get 7-8 hours of sleep each night.   General instructions  Practice stress management techniques as discussed with your health care provider.  Drink enough fluid to keep your urine clear or pale yellow.  Take over-the-counter and prescription medicines only as told by your health care provider.  Keep all follow-up visits as told by your health care provider. This is important.   Contact a  health care provider if:  Your symptoms get worse.  You have new symptoms.  You feel overwhelmed by your problems and can no longer manage them on your own. Get help right away if:  You have thoughts of hurting yourself or others. If you ever feel like you may hurt yourself or others, or have thoughts about taking your own life, get help right away. You can go to your nearest emergency department or call:  Your local emergency services (911 in the [place]  A suicide crisis helpline, such as the [facility] at 480-831-6900. This is open 24 hours a day. Summary  Stress is a normal reaction to life events. It can cause problems if it happens too often or for too long.  Practicing stress management techniques is the best way to treat stress.  Counseling or talk therapy with a mental health professional may be helpful if you are having trouble managing stress on your own. This information is not intended to replace advice given to you by your health care provider. Make sure you discuss any questions you have with your health care provider. Document Revised: 08/19/2020 Document Reviewed: 08/19/2020 Elsevier Patient Education  2021 [employer].    If you have lab work done today you will be contacted with your lab results within the next 2 weeks.  If you have not heard from [place] then please contact us. The fastest way to get your results is to register for My Chart.   IF you received an x-ray today, you will receive an invoice from [facility]. Please contact [facility] at [phone] with questions or concerns regarding your invoice.   IF you received labwork today, you will receive an invoice from [facility]. Please contact [facility] at [phone] with questions or concerns regarding your invoice.   Our billing staff will not be able to assist you with questions regarding bills from these companies.  You will be contacted with the lab  results as soon as they are available. The fastest way to get your results is to activate your My Chart account. Instructions are located on the last page of this paperwork. If you have not heard from [place] regarding the results in 2 weeks, please contact this office.

## 2021-02-01 NOTE — Progress Notes (Signed)
Subjective:  Patient ID: Jerry Johnston, male    DOB: August 03, 1976  Age: 45 y.o. MRN: [id number]  CC:  Chief Complaint  Patient presents with  . Follow-up    On sleeping issues. Pt reports he can fall asleep easy, but staying asleep is the issue. Pt reports getting 7-8 hours of sleep a night, but it's not all in once sleep cycle. Pt will wake up several times during the night. Pt reports some times waking up feeling rested, but most of the time not.    HPI Jerry Johnston presents for   Insomnia Possible situational stress, discussed at his most recent physical.  Low-dose hydroxyzine or ZzzQuil discussed along with sleep hygiene, stress management, option of counseling with numbers provided.  Option of sleep study if persistent snoring.  Tried 1/2 hydroxyzine 54m - worked ok. Sleepy/woozy next am after 238mbut able to function. Takes 1/2 pill usually after stressful day. Sometimes whole pill.  Sleeping 7 to 8 hours total but not involved with sleep cycle.  Usually sleeps through night if taking hydroxyzine.  Some wakening on nights when not taking hydroxyzine, not every time.  Snoring only when starting to fall asleep. Not during night.  Feels like sleep is better. Feels well rested most days.  Stress has been up and down.  Exercise: getting back into it. Helping.   [facility] 02/01/2021 01/04/2021 07/04/2020 01/01/2020 06/29/2019  Decreased Interest 0 0 0 0 0  Down, Depressed, Hopeless 0 0 0 0 0  PHQ - 2 Score 0 0 0 0 0   No flowsheet data found.  Feels like stress depends on the day. Most days able to handle. Declines counseling at this time. Has numbers from last visit.   History Patient Active Problem List   Diagnosis Date Noted  . Essential hypertension 12/19/2017  . Drug allergy 03/31/2013   Past Medical History:  Diagnosis Date  . Hypertension    Past Surgical History:  Procedure Laterality Date  . WRIST SURGERY     Allergies  Allergen Reactions   . Percocet [Oxycodone-Acetaminophen]     ? Reaction pt stated he had broken out during the time of taking med.   Prior to Admission medications   Medication Sig Start Date End Date Taking? Authorizing Provider  hydrOXYzine (ATARAX/VISTARIL) 25 MG tablet TAKE 0.5-1 TABLETS (12.5-25 MG TOTAL) BY MOUTH AT BEDTIME AS NEEDED. 01/26/21  Yes GrWendie AgresteMD  lisinopril (ZESTRIL) 20 MG tablet TAKE 1 TABLET BY MOUTH EVERY DAY. 01/05/21  Yes GrWendie AgresteMD   Social History   Socioeconomic History  . Marital status: Single    Spouse name: Not on file  . Number of children: Not on file  . Years of education: Not on file  . Highest education level: Not on file  Occupational History  . Occupation: ITMaterials engineerTobacco Use  . Smoking status: Never Smoker  . Smokeless tobacco: Never Used  Substance and Sexual Activity  . Alcohol use: Yes    Alcohol/week: 2.0 - 3.0 standard drinks    Types: 2 - 3 Standard drinks or equivalent per week  . Drug use: No  . Sexual activity: Yes    Birth control/protection: Condom  Other Topics Concern  . Not on file  Social History Narrative   Single. Education: [employer]  Exercise: Yes   Social Determinants of Health   Financial Resource Strain: Not on file  Food Insecurity: Not on file  Transportation Needs: Not on file  Physical Activity: Not on file  Stress: Not on file  Social Connections: Not on file  Intimate Partner Violence: Not on file    Review of Systems  Per HPI.  Objective:   Vitals:   02/01/21 1544  BP: 125/80  Pulse: 72  Temp: 98.4 F (36.9 C)  TempSrc: Temporal  SpO2: 96%  Weight: 198 lb (89.8 kg)  Height: '6\' 1"'  (1.854 m)     Physical Exam Vitals reviewed.  Constitutional:      General: He is not in acute distress.    Appearance: He is well-developed and well-nourished.  HENT:     Head: Normocephalic and atraumatic.  Cardiovascular:     Rate and Rhythm: Normal rate.  Pulmonary:     Effort: Pulmonary  effort is normal.  Neurological:     Mental Status: He is alert and oriented to person, place, and time.  Psychiatric:        Mood and Affect: Mood and affect normal.      Assessment & Plan:  Jerry Johnston is a 45 y.o. male . Insomnia, unspecified type  Situational stress Insomnia overall improving with low-dose hydroxyzine.  Dosing options discussed, continue to work on activity/exercise as that likely will also be helpful.  Denies persistent snoring, hold on sleep study/referral at this time.  Handout given on stress management again but declines other resources at this time.  Recheck 3 months, sooner if not continuing to improve or worsens.  No orders of the defined types were placed in this encounter.  Patient Instructions   If improving, ok to continue same plan for sleep at this time. Lowest effective dose of hydroxyzine.  Try to incorporate some form of activity or exercise during the day most days per week.  Meeting with therapist may be helpful if persistent stress, but see info below to see if that may be helpful as well.   Insomnia Insomnia is a sleep disorder that makes it difficult to fall asleep or stay asleep. Insomnia can cause fatigue, low energy, difficulty concentrating, mood swings, and poor performance at work or school. There are three different ways to classify insomnia:  Difficulty falling asleep.  Difficulty staying asleep.  Waking up too early in the morning. Any type of insomnia can be long-term (chronic) or short-term (acute). Both are common. Short-term insomnia usually lasts for three months or less. Chronic insomnia occurs at least three times a week for longer than three months. What are the causes? Insomnia may be caused by another condition, situation, or substance, such as:  Anxiety.  Certain medicines.  Gastroesophageal reflux disease (GERD) or other gastrointestinal conditions.  Asthma or other breathing conditions.  Restless legs  syndrome, sleep apnea, or other sleep disorders.  Chronic pain.  Menopause.  Stroke.  Abuse of alcohol, tobacco, or illegal drugs.  Mental health conditions, such as depression.  Caffeine.  Neurological disorders, such as Alzheimer's disease.  An overactive thyroid (hyperthyroidism). Sometimes, the cause of insomnia may not be known. What increases the risk? Risk factors for insomnia include:  Gender. Women are affected more often than men.  Age. Insomnia is more common as you get older.  Stress.  Lack of exercise.  Irregular work schedule or working night shifts.  Traveling between different time zones.  Certain medical and mental health conditions. What are the signs or symptoms? If you have insomnia, the main symptom is having trouble falling asleep or having trouble staying asleep. This may  lead to other symptoms, such as:  Feeling fatigued or having low energy.  Feeling nervous about going to sleep.  Not feeling rested in the morning.  Having trouble concentrating.  Feeling irritable, anxious, or depressed. How is this diagnosed? This condition may be diagnosed based on:  Your symptoms and medical history. Your health care provider may ask about: ? Your sleep habits. ? Any medical conditions you have. ? Your mental health.  A physical exam. How is this treated? Treatment for insomnia depends on the cause. Treatment may focus on treating an underlying condition that is causing insomnia. Treatment may also include:  Medicines to help you sleep.  Counseling or therapy.  Lifestyle adjustments to help you sleep better. Follow these instructions at home: Eating and drinking  Limit or avoid alcohol, caffeinated beverages, and cigarettes, especially close to bedtime. These can disrupt your sleep.  Do not eat a large meal or eat spicy foods right before bedtime. This can lead to digestive discomfort that can make it hard for you to sleep.   Sleep  habits  Keep a sleep diary to help you and your health care provider figure out what could be causing your insomnia. Write down: ? When you sleep. ? When you wake up during the night. ? How well you sleep. ? How rested you feel the next day. ? Any side effects of medicines you are taking. ? What you eat and drink.  Make your bedroom a dark, comfortable place where it is easy to fall asleep. ? Put up shades or blackout curtains to block light from outside. ? Use a white noise machine to block noise. ? Keep the temperature cool.  Limit screen use before bedtime. This includes: ? Watching TV. ? Using your smartphone, tablet, or computer.  Stick to a routine that includes going to bed and waking up at the same times every day and night. This can help you fall asleep faster. Consider making a quiet activity, such as reading, part of your nighttime routine.  Try to avoid taking naps during the day so that you sleep better at night.  Get out of bed if you are still awake after 15 minutes of trying to sleep. Keep the lights down, but try reading or doing a quiet activity. When you feel sleepy, go back to bed.   General instructions  Take over-the-counter and prescription medicines only as told by your health care provider.  Exercise regularly, as told by your health care provider. Avoid exercise starting several hours before bedtime.  Use relaxation techniques to manage stress. Ask your health care provider to suggest some techniques that may work well for you. These may include: ? Breathing exercises. ? Routines to release muscle tension. ? Visualizing peaceful scenes.  Make sure that you drive carefully. Avoid driving if you feel very sleepy.  Keep all follow-up visits as told by your health care provider. This is important. Contact a health care provider if:  You are tired throughout the day.  You have trouble in your daily routine due to sleepiness.  You continue to have sleep  problems, or your sleep problems get worse. Get help right away if:  You have serious thoughts about hurting yourself or someone else. If you ever feel like you may hurt yourself or others, or have thoughts about taking your own life, get help right away. You can go to your nearest emergency department or call:  Your local emergency services (911 in the [place]  A  suicide crisis helpline, such as the [place] at 302-669-3085. This is open 24 hours a day. Summary  Insomnia is a sleep disorder that makes it difficult to fall asleep or stay asleep.  Insomnia can be long-term (chronic) or short-term (acute).  Treatment for insomnia depends on the cause. Treatment may focus on treating an underlying condition that is causing insomnia.  Keep a sleep diary to help you and your health care provider figure out what could be causing your insomnia. This information is not intended to replace advice given to you by your health care provider. Make sure you discuss any questions you have with your health care provider. Document Revised: 10/13/2020 Document Reviewed: 10/13/2020 Elsevier Patient Education  2021 [employer] (9th ed., pp. [phone]). [place], [place]: [employer].">  Stress, Adult Stress is a normal reaction to life events. Stress is what you feel when life demands more than you are used to, or more than you think you can handle. Some stress can be useful, such as studying for a test or meeting a deadline at work. Stress that occurs too often or for too long can cause problems. It can affect your emotional health and interfere with relationships and normal daily activities. Too much stress can weaken your body's defense system (immune system) and increase your risk for physical illness. If you already have a medical problem, stress can make it worse. What are the causes? All sorts of life events can cause stress. An event that causes  stress for one person may not be stressful for another person. Major life events, whether positive or negative, commonly cause stress. Examples include:  Losing a job or starting a new job.  Losing a loved one.  Moving to a new town or home.  Getting married or divorced.  Having a baby.  Getting injured or sick. Less obvious life events can also cause stress, especially if they occur day after day or in combination with each other. Examples include:  Working long hours.  Driving in traffic.  Caring for children.  Being in debt.  Being in a difficult relationship. What are the signs or symptoms? Stress can cause emotional symptoms, including:  Anxiety. This is feeling worried, afraid, on edge, overwhelmed, or out of control.  Anger, including irritation or impatience.  Depression. This is feeling sad, down, helpless, or guilty.  Trouble focusing, remembering, or making decisions. Stress can cause physical symptoms, including:  Aches and pains. These may affect your head, neck, back, stomach, or other areas of your body.  Tight muscles or a clenched jaw.  Low energy.  Trouble sleeping. Stress can cause unhealthy behaviors, including:  Eating to feel better (overeating) or skipping meals.  Working too much or putting off tasks.  Smoking, drinking alcohol, or using drugs to feel better. How is this diagnosed? Stress is diagnosed through an assessment by your health care provider. He or she may diagnose this condition based on:  Your symptoms and any stressful life events.  Your medical history.  Tests to rule out other causes of your symptoms. Depending on your condition, your health care provider may refer you to a specialist for further evaluation. How is this treated? Stress management techniques are the recommended treatment for stress. Medicine is not typically recommended for the treatment of stress. Techniques to reduce your reaction to stressful life  events include:  Stress identification. Monitor yourself for symptoms of stress and identify what causes stress for you. These  skills may help you to avoid or prepare for stressful events.  Time management. Set your priorities, keep a calendar of events, and learn to say no. Taking these actions can help you avoid making too many commitments. Techniques for coping with stress include:  Rethinking the problem. Try to think realistically about stressful events rather than ignoring them or overreacting. Try to find the positives in a stressful situation rather than focusing on the negatives.  Exercise. Physical exercise can release both physical and emotional tension. The key is to find a form of exercise that you enjoy and do it regularly.  Relaxation techniques. These relax the body and mind. The key is to find one or more that you enjoy and use the techniques regularly. Examples include: ? Meditation, deep breathing, or progressive relaxation techniques. ? Yoga or tai chi. ? Biofeedback, mindfulness techniques, or journaling. ? Listening to music, being out in nature, or participating in other hobbies.  Practicing a healthy lifestyle. Eat a balanced diet, drink plenty of water, limit or avoid caffeine, and get plenty of sleep.  Having a strong support network. Spend time with family, friends, or other people you enjoy being around. Express your feelings and talk things over with someone you trust. Counseling or talk therapy with a mental health professional may be helpful if you are having trouble managing stress on your own.   Follow these instructions at home: Lifestyle  Avoid drugs.  Do not use any products that contain nicotine or tobacco, such as cigarettes, e-cigarettes, and chewing tobacco. If you need help quitting, ask your health care provider.  Limit alcohol intake to no more than 1 drink a day for nonpregnant women and 2 drinks a day for men. One drink equals 12 oz of beer, 5  oz of wine, or 1 oz of hard liquor  Do not use alcohol or drugs to relax.  Eat a balanced diet that includes fresh fruits and vegetables, whole grains, lean meats, fish, eggs, and beans, and low-fat dairy. Avoid processed foods and foods high in added fat, sugar, and salt.  Exercise at least 30 minutes on 5 or more days each week.  Get 7-8 hours of sleep each night.   General instructions  Practice stress management techniques as discussed with your health care provider.  Drink enough fluid to keep your urine clear or pale yellow.  Take over-the-counter and prescription medicines only as told by your health care provider.  Keep all follow-up visits as told by your health care provider. This is important.   Contact a health care provider if:  Your symptoms get worse.  You have new symptoms.  You feel overwhelmed by your problems and can no longer manage them on your own. Get help right away if:  You have thoughts of hurting yourself or others. If you ever feel like you may hurt yourself or others, or have thoughts about taking your own life, get help right away. You can go to your nearest emergency department or call:  Your local emergency services (911 in the [place]  A suicide crisis helpline, such as the [place] at 754-007-7451. This is open 24 hours a day. Summary  Stress is a normal reaction to life events. It can cause problems if it happens too often or for too long.  Practicing stress management techniques is the best way to treat stress.  Counseling or talk therapy with a mental health professional may be helpful if you are having trouble managing  stress on your own. This information is not intended to replace advice given to you by your health care provider. Make sure you discuss any questions you have with your health care provider. Document Revised: 08/19/2020 Document Reviewed: 08/19/2020 Elsevier Patient Education  2021 [employer].    If you have lab work done today you will be contacted with your lab results within the next 2 weeks.  If you have not heard from [place] then please contact us. The fastest way to get your results is to register for My Chart.   IF you received an x-ray today, you will receive an invoice from [facility]. Please contact [facility] at [phone] with questions or concerns regarding your invoice.   IF you received labwork today, you will receive an invoice from [facility]. Please contact [facility] at [phone] with questions or concerns regarding your invoice.   Our billing staff will not be able to assist you with questions regarding bills from these companies.  You will be contacted with the lab results as soon as they are available. The fastest way to get your results is to activate your My Chart account. Instructions are located on the last page of this paperwork. If you have not heard from [place] regarding the results in 2 weeks, please contact this office.         Signed, Merri Ray, MD [facility]

## 2021-05-03 ENCOUNTER — Ambulatory Visit (INDEPENDENT_AMBULATORY_CARE_PROVIDER_SITE_OTHER): Payer: Managed Care, Other (non HMO) | Admitting: Family Medicine

## 2021-05-03 ENCOUNTER — Other Ambulatory Visit: Payer: Self-pay

## 2021-05-03 ENCOUNTER — Encounter: Payer: Self-pay | Admitting: Family Medicine

## 2021-05-03 VITALS — BP 118/76 | HR 82 | Temp 98.2°F | Resp 17 | Ht 73.0 in | Wt 195.6 lb

## 2021-05-03 DIAGNOSIS — F439 Reaction to severe stress, unspecified: Secondary | ICD-10-CM

## 2021-05-03 DIAGNOSIS — R42 Dizziness and giddiness: Secondary | ICD-10-CM

## 2021-05-03 DIAGNOSIS — G4700 Insomnia, unspecified: Secondary | ICD-10-CM

## 2021-05-03 DIAGNOSIS — I10 Essential (primary) hypertension: Secondary | ICD-10-CM

## 2021-05-03 NOTE — Progress Notes (Signed)
Subjective:  Patient ID: Jerry Johnston, male    DOB: 12/02/1976  Age: 45 y.o. MRN: [id number]  CC:  Chief Complaint  Patient presents with  . Insomnia    Pt reports when having stressful evenings/afternoons he does not sleep well even with Hydroxyzine, does help on average day, pt reports 2-3 nights will sleep whole night or have occasions when he wakes up for a few minutes.   . Hypertension    Pt reports 2 moments he was light headed last week on one day, pt reports this did not happen previously or since then. Denies other physical symptoms     HPI Jerry Johnston presents for   Insomnia Last discussed in February.  Some improvement with hydroxyzine.  Some situational stress at that time.  Restarting exercise recommended as well as meeting with therapist if needed.  Handout given on stress management.  Still some insomnia, less with use of hydroxyzine.  Still some wakening with either half or full dose.  Less groggy with half pill.  Taking 30 minutes prior to bedtime has been helpful.  Typically has 2-3 nights with a short to medium interruption of sleep of 15 to 30 minutes, 1 or 2 nights with longer interruptions or multiple short interruptions.  Does not appear to be different with or without medication.  He has been working on reducing stress, trying to improve sleep hygiene with winding down before going to bed.  Compartmentalizing work stressors and not focusing on those prior to bedtime better.  Also reduce variation in sleep environment between his mother's house once per month, his fiance's place few nights per week or his own.  Of his fluid intakes just before bedtime.  Still plans to increase exercise. Not experiencing snoring.   Upcoming project has been stressful, but mostly together and should be easier afterwards. Some relationship stress.   Has counseling session next week - 1st session. Partner has seen same therapist. Individual counseling initially. Will go to gym after  booster vaccine. Going to basketball games or sports helpful - feels more normal.   Felt depressed yesterday with relationship stress yesterday. No SI. Replaying situation in head multiple times. Not traveling as in past also stressful.  Alcohol - minimal, 3 drinks per month. No IDU. No CBD.  Depression screen [facility] 2/9 05/03/2021 02/01/2021 01/04/2021 07/04/2020 01/01/2020  Decreased Interest 0 0 0 0 0  Down, Depressed, Hopeless 1 0 0 0 0  PHQ - 2 Score 1 0 0 0 0    Hypertension: Lightheaded 2 episodes in one day last week. noted with standing. Resolved in 1 minute. no cp/palpitations. No HA/focal weakness.  Not currently. Has been drinking water sufficiently. Did not check BP.  BP Readings from Last 3 Encounters:  05/03/21 118/76  02/01/21 125/80  01/04/21 134/80   Lab Results  Component Value Date   CREATININE 1.29 (H) 01/04/2021        History Patient Active Problem List   Diagnosis Date Noted  . Essential hypertension 12/19/2017  . Drug allergy 03/31/2013   Past Medical History:  Diagnosis Date  . Hypertension    Past Surgical History:  Procedure Laterality Date  . WRIST SURGERY     Allergies  Allergen Reactions  . Percocet [Oxycodone-Acetaminophen]     ? Reaction pt stated he had broken out during the time of taking med.   Prior to Admission medications   Medication Sig Start Date End Date Taking? Authorizing Provider  hydrOXYzine (ATARAX/VISTARIL) 25  MG tablet TAKE 0.5-1 TABLETS (12.5-25 MG TOTAL) BY MOUTH AT BEDTIME AS NEEDED. 01/26/21  Yes [name], MD  lisinopril (ZESTRIL) 20 MG tablet TAKE 1 TABLET BY MOUTH EVERY DAY. 01/05/21  Yes [name], MD   Social History   Socioeconomic History  . Marital status: Single    Spouse name: Not on file  . Number of children: Not on file  . Years of education: Not on file  . Highest education level: Not on file  Occupational History  . Occupation: Psychiatric nurse  Tobacco Use  . Smoking status: Never Smoker   . Smokeless tobacco: Never Used  Substance and Sexual Activity  . Alcohol use: Yes    Alcohol/week: 2.0 - 3.0 standard drinks    Types: 2 - 3 Standard drinks or equivalent per week  . Drug use: No  . Sexual activity: Yes    Birth control/protection: Condom  Other Topics Concern  . Not on file  Social History Narrative   Single. Education: [employer].   Exercise: Yes   Social Determinants of Health   Financial Resource Strain: Not on file  Food Insecurity: Not on file  Transportation Needs: Not on file  Physical Activity: Not on file  Stress: Not on file  Social Connections: Not on file  Intimate Partner Violence: Not on file    Review of Systems  Constitutional: Negative for fatigue and unexpected weight change.  Eyes: Negative for visual disturbance.  Respiratory: Negative for cough, chest tightness and shortness of breath.   Cardiovascular: Negative for chest pain, palpitations and leg swelling.  Gastrointestinal: Negative for abdominal pain and blood in stool (single episode wiht hemorrhoids, not recurrent. ).  Neurological: Positive for light-headedness. Negative for dizziness and headaches.  other per HPI.   Objective:   Vitals:   05/03/21 0821  BP: 118/76  Pulse: 82  Resp: 17  Temp: 98.2 F (36.8 C)  TempSrc: Temporal  SpO2: 96%  Weight: 195 lb 9.6 oz (88.7 kg)  Height: 6\' 1"  (1.854 m)     Physical Exam Vitals reviewed.  Constitutional:      Appearance: He is well-developed.  HENT:     Head: Normocephalic and atraumatic.  Eyes:     Pupils: Pupils are equal, round, and reactive to light.  Neck:     Vascular: No carotid bruit or JVD.  Cardiovascular:     Rate and Rhythm: Normal rate and regular rhythm.     Heart sounds: Normal heart sounds. No murmur heard.   Pulmonary:     Effort: Pulmonary effort is normal.     Breath sounds: Normal breath sounds. No rales.  Skin:    General: Skin is warm and dry.  Neurological:     General: No focal deficit  present.     Mental Status: He is alert and oriented to person, place, and time.  Psychiatric:        Attention and Perception: Attention and perception normal.        Mood and Affect: Mood normal.        Speech: Speech normal.        Behavior: Behavior normal. Behavior is cooperative.        Thought Content: Thought content normal. Thought content is not paranoid. Thought content does not include homicidal or suicidal ideation.     Comments: Not responding to internal stimuli     37 minutes spent during visit, greater than 50% counseling and assimilation of information, chart review, and  discussion of plan.    Assessment & Plan:  Jerry Johnston is a 45 y.o. male . Situational stress Insomnia, unspecified type  -May also have component of adjustment disorder with change in routine with pandemic, working from home, and with relationship stressors.  Anticipate meeting with therapist will be helpful.  Discussed possible need for low-dose SSRI but deferred at this time.  Continue to compartmentalize with work/home time including possible physical change in location after work to help with that transition.  Anticipate returning to exercise at the gym after his booster will also be helpful.  Various sleep medication options discussed but would like to remain on hydroxyzine at this time.  Continued sleep hygiene discussed.  63-month follow-up, sooner if new or worsening symptoms.  Essential hypertension Episodic lightheadedness  -Hypertension stable, 2 episodes of lightheadedness last week, no recurrence.  RTC precautions if recurs and monitor home readings if symptoms return.  No orders of the defined types were placed in this encounter.  Patient Instructions  Keep up the good work with compartmentalizing for work stressors. Leaving home temporarily after working may also help transition. Meeting with therapist may be helpful - ok to continue hydroxyzine for now. If you are feeling more  down/depression symptoms persistently or anxiety, I would consider a daily low dose SSRi like Zoloft. Let me know how therapist visit goes.   If any return of lightheadedness, check you blood pressure and let me know.   Recheck in 3 months but let me know if there are questions sooner.       Signed, [name], MD [facility]

## 2021-05-03 NOTE — Patient Instructions (Addendum)
Keep up the good work with compartmentalizing for work stressors. Leaving home temporarily after working may also help transition. Meeting with therapist may be helpful - ok to continue hydroxyzine for now. If you are feeling more down/depression symptoms persistently or anxiety, I would consider a daily low dose SSRi like Zoloft. Let me know how therapist visit goes.   If any return of lightheadedness, check you blood pressure and let me know.   Recheck in 3 months but let me know if there are questions sooner.

## 2021-08-03 ENCOUNTER — Ambulatory Visit (INDEPENDENT_AMBULATORY_CARE_PROVIDER_SITE_OTHER): Payer: Managed Care, Other (non HMO) | Admitting: Family Medicine

## 2021-08-03 ENCOUNTER — Other Ambulatory Visit: Payer: Self-pay

## 2021-08-03 VITALS — BP 118/78 | HR 70 | Temp 98.2°F | Resp 17 | Ht 73.0 in | Wt 200.8 lb

## 2021-08-03 DIAGNOSIS — E785 Hyperlipidemia, unspecified: Secondary | ICD-10-CM

## 2021-08-03 DIAGNOSIS — Z131 Encounter for screening for diabetes mellitus: Secondary | ICD-10-CM

## 2021-08-03 DIAGNOSIS — I10 Essential (primary) hypertension: Secondary | ICD-10-CM

## 2021-08-03 DIAGNOSIS — G4700 Insomnia, unspecified: Secondary | ICD-10-CM

## 2021-08-03 LAB — COMPREHENSIVE METABOLIC PANEL
ALT: 37 U/L (ref 0–53)
AST: 28 U/L (ref 0–37)
Albumin: 4.6 g/dL (ref 3.5–5.2)
Alkaline Phosphatase: 63 U/L (ref 39–117)
BUN: 14 mg/dL (ref 6–23)
CO2: 27 mEq/L (ref 19–32)
Calcium: 9.9 mg/dL (ref 8.4–10.5)
Chloride: 102 mEq/L (ref 96–112)
Creatinine, Ser: 1.24 mg/dL (ref 0.40–1.50)
GFR: 71.41 mL/min (ref >60.00)
Glucose, Bld: 77 mg/dL (ref 70–99)
Potassium: 4.4 mEq/L (ref 3.5–5.1)
Sodium: 137 mEq/L (ref 135–145)
Total Bilirubin: 0.6 mg/dL (ref 0.2–1.2)
Total Protein: 7.6 g/dL (ref 6.0–8.3)

## 2021-08-03 LAB — HEMOGLOBIN A1C: Hgb A1c MFr Bld: 5.8 % (ref 4.6–6.5)

## 2021-08-03 LAB — LIPID PANEL
Cholesterol: 246 mg/dL — ABNORMAL HIGH (ref 0–200)
HDL: 54.5 mg/dL (ref >39.00)
LDL Cholesterol: 172 mg/dL — ABNORMAL HIGH (ref 0–99)
NonHDL: 191.42
Total CHOL/HDL Ratio: 5
Triglycerides: 96 mg/dL (ref 0.0–149.0)
VLDL: 19.2 mg/dL (ref 0.0–40.0)

## 2021-08-03 MED ORDER — LISINOPRIL 20 MG PO TABS
ORAL_TABLET | ORAL | 2 refills | Status: DC
Start: 2021-08-03 — End: 2022-02-08

## 2021-08-03 NOTE — Patient Instructions (Signed)
Try to avoid sugar containing beverages as possible and restart some for of exercise most days per week - goal of 150 minutes per week.   No med changes at this time. Let me know if there are any questions and take care.

## 2021-08-03 NOTE — Progress Notes (Signed)
Subjective:  Patient ID: Jerry Johnston, male    DOB: 13-Jan-1976  Age: 45 y.o. MRN: [id number]  CC:  Chief Complaint  Patient presents with   Hypertension    Pt here for recheck on bp, doing well denies physical sxs, refill lisinopril today    Prediabetes    Pt here for recheck on lab work,  A1C  5.7 in January    Insomnia    Pt takes hydroxizine due to insomnia when needed no refill needed todat per pt.     HPI Jerry Johnston presents for   Insomnia/situational stress Last discussed May 18 visit.  Possible component of adjustment disorder, recommended meeting with therapist possible need for SSRI but deferred at that time.  He had been working on Holiday representative for work stressors.  Other stress management discussed.  Hydroxyzine was continued for sleep as needed. Several sessions with therapist helped.  Doing better with stress. Things are better.  Hydroxyzine rarely needed - 1/2 pill. Some weeks not at all. Has gone into office a few times - feels good doing this instead of being at home.   Depression screen [facility] 2/9 08/03/2021 05/03/2021 02/01/2021 01/04/2021 07/04/2020  Decreased Interest 0 0 0 0 0  Down, Depressed, Hopeless 0 1 0 0 0  PHQ - 2 Score 0 1 0 0 0  Altered sleeping 0 - - - -  Tired, decreased energy 0 - - - -  Change in appetite 0 - - - -  Feeling bad or failure about yourself  0 - - - -  Trouble concentrating 1 - - - -  Moving slowly or fidgety/restless 0 - - - -  Suicidal thoughts 0 - - - -  PHQ-9 Score 1 - - - -     Hypertension: Lisinopril 20 mg daily. Home readings: stable - controlled. No new side effects.  No recent lightheadedness.  BP Readings from Last 3 Encounters:  08/03/21 118/78  05/03/21 118/76  02/01/21 125/80   Lab Results  Component Value Date   CREATININE 1.29 (H) 01/04/2021   Hyperlipidemia: No current statin/meds, low ASCVD score at 6.6% previously.  Lifestyle modification, diet, exercise planned.  Borderline A1c at 5.7 in  January. Lab Results  Component Value Date   CHOL 232 (H) 01/04/2021   HDL 56 01/04/2021   LDLCALC 161 (H) 01/04/2021   TRIG 88 01/04/2021   CHOLHDL 4.1 01/04/2021   Lab Results  Component Value Date   ALT 31 01/04/2021   AST 30 01/04/2021   ALKPHOS 82 01/04/2021   BILITOT 0.6 01/04/2021   Prediabetes: Exercise - less recently due to work demands.  Rare soda - usually none.  Fast food 3-4x/week.  [employer] beverages 3 per week.   Lab Results  Component Value Date   HGBA1C 5.7 (H) 01/04/2021   Wt Readings from Last 3 Encounters:  08/03/21 200 lb 12.8 oz (91.1 kg)  05/03/21 195 lb 9.6 oz (88.7 kg)  02/01/21 198 lb (89.8 kg)      History Patient Active Problem List   Diagnosis Date Noted   Essential hypertension 12/19/2017   Drug allergy 03/31/2013   Past Medical History:  Diagnosis Date   Hypertension    Past Surgical History:  Procedure Laterality Date   WRIST SURGERY     Allergies  Allergen Reactions   Percocet [Oxycodone-Acetaminophen]     ? Reaction pt stated he had broken out during the time of taking med.   Prior to Admission medications  Medication Sig Start Date End Date Taking? Authorizing Provider  hydrOXYzine (ATARAX/VISTARIL) 25 MG tablet TAKE 0.5-1 TABLETS (12.5-25 MG TOTAL) BY MOUTH AT BEDTIME AS NEEDED. 01/26/21   [name], MD  lisinopril (ZESTRIL) 20 MG tablet TAKE 1 TABLET BY MOUTH EVERY DAY. 01/05/21   [name], MD   Social History   Socioeconomic History   Marital status: Single    Spouse name: Not on file   Number of children: Not on file   Years of education: Not on file   Highest education level: Not on file  Occupational History   Occupation: Psychiatric nurse  Tobacco Use   Smoking status: Never   Smokeless tobacco: Never  Substance and Sexual Activity   Alcohol use: Yes    Alcohol/week: 2.0 - 3.0 standard drinks    Types: 2 - 3 Standard drinks or equivalent per week   Drug use: No   Sexual activity: Yes     Birth control/protection: Condom  Other Topics Concern   Not on file  Social History Narrative   Single. Education: [employer].   Exercise: Yes   Social Determinants of Health   Financial Resource Strain: Not on file  Food Insecurity: Not on file  Transportation Needs: Not on file  Physical Activity: Not on file  Stress: Not on file  Social Connections: Not on file  Intimate Partner Violence: Not on file    Review of Systems  Constitutional:  Negative for fatigue and unexpected weight change.  Eyes:  Negative for visual disturbance.  Respiratory:  Negative for cough, chest tightness and shortness of breath.   Cardiovascular:  Negative for chest pain, palpitations and leg swelling.  Gastrointestinal:  Negative for abdominal pain and blood in stool.  Neurological:  Negative for dizziness, light-headedness and headaches.    Objective:   Vitals:   08/03/21 0848  BP: 118/78  Pulse: 70  Resp: 17  Temp: 98.2 F (36.8 C)  TempSrc: Temporal  SpO2: 98%  Weight: 200 lb 12.8 oz (91.1 kg)  Height: 6\' 1"  (1.854 m)     Physical Exam Vitals reviewed.  Constitutional:      Appearance: He is well-developed.  HENT:     Head: Normocephalic and atraumatic.  Neck:     Vascular: No carotid bruit or JVD.  Cardiovascular:     Rate and Rhythm: Normal rate and regular rhythm.     Heart sounds: Normal heart sounds. No murmur heard. Pulmonary:     Effort: Pulmonary effort is normal.     Breath sounds: Normal breath sounds. No rales.  Musculoskeletal:     Right lower leg: No edema.     Left lower leg: No edema.  Skin:    General: Skin is warm and dry.  Neurological:     Mental Status: He is alert and oriented to person, place, and time.  Psychiatric:        Mood and Affect: Mood normal.        Behavior: Behavior normal.        Thought Content: Thought content normal.     Assessment & Plan:  CHANDLER Johnston is a 45 y.o. male . Essential hypertension - Plan: Comprehensive  metabolic panel, lisinopril (ZESTRIL) 20 MG tablet  -Stable on current regimen, continue same  Insomnia, unspecified type  -Improved stress, status post meeting with therapist.  Continued coping techniques, stress management discussed, no new medications at this time, rare need for hydroxyzine, continue same.  Hyperlipidemia, unspecified hyperlipidemia type -  Plan: Comprehensive metabolic panel, Lipid panel  -Check labs, but plans to increase exercise, lifestyle modification, diet changes discussed.  Screening for diabetes mellitus - Plan: Comprehensive metabolic panel, Hemoglobin A1c  -A1c barely at prediabetes range previously, repeat testing, lifestyle modification as above.  Meds ordered this encounter  Medications   lisinopril (ZESTRIL) 20 MG tablet    Sig: TAKE 1 TABLET BY MOUTH EVERY DAY.    Dispense:  90 tablet    Refill:  2    Patient Instructions  Try to avoid sugar containing beverages as possible and restart some for of exercise most days per week - goal of 150 minutes per week.   No med changes at this time. Let me know if there are any questions and take care.     Signed,   [name], MD [facility], [facility] 08/03/21 9:06 AM

## 2021-08-25 ENCOUNTER — Other Ambulatory Visit: Payer: Self-pay | Admitting: Family Medicine

## 2021-08-25 DIAGNOSIS — G4700 Insomnia, unspecified: Secondary | ICD-10-CM

## 2021-08-25 NOTE — Telephone Encounter (Signed)
Medication recently discussed at his office visit.  Refill ordered

## 2021-08-25 NOTE — Telephone Encounter (Signed)
LFD 01/26/21 #90 with 1 refill LOV 08/03/21 NOV 02/08/22

## 2022-02-08 ENCOUNTER — Ambulatory Visit (INDEPENDENT_AMBULATORY_CARE_PROVIDER_SITE_OTHER): Payer: Managed Care, Other (non HMO) | Admitting: Family Medicine

## 2022-02-08 ENCOUNTER — Encounter: Payer: Self-pay | Admitting: Family Medicine

## 2022-02-08 VITALS — BP 126/70 | HR 69 | Temp 98.0°F | Resp 15 | Ht 73.0 in | Wt 197.4 lb

## 2022-02-08 DIAGNOSIS — Z0000 Encounter for general adult medical examination without abnormal findings: Secondary | ICD-10-CM

## 2022-02-08 DIAGNOSIS — E785 Hyperlipidemia, unspecified: Secondary | ICD-10-CM | POA: Diagnosis not present

## 2022-02-08 DIAGNOSIS — Z131 Encounter for screening for diabetes mellitus: Secondary | ICD-10-CM | POA: Diagnosis not present

## 2022-02-08 DIAGNOSIS — I10 Essential (primary) hypertension: Secondary | ICD-10-CM | POA: Diagnosis not present

## 2022-02-08 DIAGNOSIS — Z125 Encounter for screening for malignant neoplasm of prostate: Secondary | ICD-10-CM

## 2022-02-08 DIAGNOSIS — Z87898 Personal history of other specified conditions: Secondary | ICD-10-CM

## 2022-02-08 DIAGNOSIS — Z113 Encounter for screening for infections with a predominantly sexual mode of transmission: Secondary | ICD-10-CM

## 2022-02-08 LAB — COMPREHENSIVE METABOLIC PANEL
ALT: 26 U/L (ref 0–53)
AST: 25 U/L (ref 0–37)
Albumin: 4.7 g/dL (ref 3.5–5.2)
Alkaline Phosphatase: 65 U/L (ref 39–117)
BUN: 14 mg/dL (ref 6–23)
CO2: 28 mEq/L (ref 19–32)
Calcium: 9.9 mg/dL (ref 8.4–10.5)
Chloride: 103 mEq/L (ref 96–112)
Creatinine, Ser: 1.21 mg/dL (ref 0.40–1.50)
GFR: 73.28 mL/min (ref >60.00)
Glucose, Bld: 85 mg/dL (ref 70–99)
Potassium: 4.1 mEq/L (ref 3.5–5.1)
Sodium: 137 mEq/L (ref 135–145)
Total Bilirubin: 0.9 mg/dL (ref 0.2–1.2)
Total Protein: 7.5 g/dL (ref 6.0–8.3)

## 2022-02-08 LAB — LIPID PANEL
Cholesterol: 216 mg/dL — ABNORMAL HIGH (ref 0–200)
HDL: 47.7 mg/dL (ref >39.00)
LDL Cholesterol: 144 mg/dL — ABNORMAL HIGH (ref 0–99)
NonHDL: 168.17
Total CHOL/HDL Ratio: 5
Triglycerides: 120 mg/dL (ref 0.0–149.0)
VLDL: 24 mg/dL (ref 0.0–40.0)

## 2022-02-08 LAB — HEMOGLOBIN A1C: Hgb A1c MFr Bld: 5.7 % (ref 4.6–6.5)

## 2022-02-08 LAB — PSA: PSA: 1.19 ng/mL (ref 0.10–4.00)

## 2022-02-08 MED ORDER — LISINOPRIL 20 MG PO TABS: ORAL_TABLET | ORAL | 2 refills | Status: DC

## 2022-02-08 NOTE — Progress Notes (Signed)
Subjective:  Patient ID: Jerry Johnston, male    DOB: [birth year]  Age: 46 y.o. MRN: [id number]  CC:  Chief Complaint  Patient presents with   Annual Exam    Pt is doing well, no concerns at this time     HPI Jerry Johnston presents for   Annual physical exam. Doing well, less stressful job, less burnout.  Rare need for hydroxyzine for sleep - every few weeks. Doing well.   Hypertension: Lisinopril 20mg  qd.  Home readings: controlled 123-130/68-86 BP Readings from Last 3 Encounters:  02/08/22 126/70  08/03/21 118/78  05/03/21 118/76   Lab Results  Component Value Date   CREATININE 1.24 08/03/2021   Hyperlipidemia: Elevated in August, ASCVD risk score 5.5%, diet/exercise approach.  A1c at prediabetes levels well at 5.8, minimal change from 5.7 previously.  Weight down 3 pounds. Increased walking, better breakfast, less sugar. Fiber supplement and smoothies. Bowel movements better.  No FH of early CAD.  Wt Readings from Last 3 Encounters:  02/08/22 197 lb 6.4 oz (89.5 kg)  08/03/21 200 lb 12.8 oz (91.1 kg)  05/03/21 195 lb 9.6 oz (88.7 kg)    Lab Results  Component Value Date   CHOL 246 (H) 08/03/2021   HDL 54.50 08/03/2021   LDLCALC 172 (H) 08/03/2021   TRIG 96.0 08/03/2021   CHOLHDL 5 08/03/2021   Lab Results  Component Value Date   ALT 37 08/03/2021   AST 28 08/03/2021   ALKPHOS 63 08/03/2021   BILITOT 0.6 08/03/2021   Cancer screening: Paternal GF with lung CA, paternal GM pancreatic.  Father with pancreatic CA, or stomach CA.  The natural history of prostate cancer and ongoing controversy regarding screening and potential treatment outcomes of prostate cancer has been discussed with the patient. The meaning of a false positive PSA and a false negative PSA has been discussed. He indicates understanding of the limitations of this screening test and wishes to proceed with screening PSA testing today.   Lab Results  Component Value Date   PSA1 1.8  01/04/2021   PSA1 1.2 01/01/2020   PSA1 1.4 12/29/2018    Immunization History  Administered Date(s) Administered   Hepatitis A 03/28/2007, 09/30/2007   Hepatitis B 03/28/2007, 04/28/2007, 09/30/2007, 05/31/2014   Hepatitis B, adult 05/31/2014   Influenza Inj Mdck Quad Pf 09/19/2017   Influenza Inj Mdck Quad With Preservative 12/20/2018   Influenza Split 09/30/2013, 09/30/2014   Influenza,inj,Quad PF,6+ Mos 12/05/2015, 12/06/2016, 10/05/2019   PFIZER(Purple Top)SARS-COV-2 Vaccination 03/09/2020, 03/30/2020, 05/05/2021   Tdap 03/28/2007, 06/06/2017  Did have the bivalent covid vaccine in October with flu shot at pharmacy.   STI screening: Requests testing. No new partners, relationship going ok.   Optho No corrective lenses.  exam few weeks ago. FH of glaucoma, recheck in 2 years.   Dental Every 6 months - appt few weeks ago.  Exercise/diet/overweight Weight improved by 3 pounds as above. Diet changes as above. 3-5 days per week exercise - walking for about an hour.plans to return to gym and some resistance exercise.    History Patient Active Problem List   Diagnosis Date Noted   Essential hypertension 12/19/2017   Drug allergy 03/31/2013   Past Medical History:  Diagnosis Date   Hypertension    Past Surgical History:  Procedure Laterality Date   WRIST SURGERY     Allergies  Allergen Reactions   Percocet [Oxycodone-Acetaminophen]     ? Reaction pt stated he had broken out during the  time of taking med.   Prior to Admission medications   Medication Sig Start Date End Date Taking? Authorizing Provider  hydrOXYzine (ATARAX/VISTARIL) 25 MG tablet TAKE 0.5-1 TABLETS (12.5-25 MG TOTAL) BY MOUTH AT BEDTIME AS NEEDED. 08/25/21  Yes [name], MD  lisinopril (ZESTRIL) 20 MG tablet TAKE 1 TABLET BY MOUTH EVERY DAY. 08/03/21  Yes [name], MD   Social History   Socioeconomic History   Marital status: Single    Spouse name: Not on file   Number of  children: Not on file   Years of education: Not on file   Highest education level: Not on file  Occupational History   Occupation: Psychiatric nurse  Tobacco Use   Smoking status: Never   Smokeless tobacco: Never  Substance and Sexual Activity   Alcohol use: Yes    Alcohol/week: 2.0 - 3.0 standard drinks    Types: 2 - 3 Standard drinks or equivalent per week   Drug use: No   Sexual activity: Yes    Birth control/protection: Condom  Other Topics Concern   Not on file  Social History Narrative   Single. Education: [employer].   Exercise: Yes   Social Determinants of Health   Financial Resource Strain: Not on file  Food Insecurity: Not on file  Transportation Needs: Not on file  Physical Activity: Not on file  Stress: Not on file  Social Connections: Not on file  Intimate Partner Violence: Not on file    Review of Systems 13 point review of systems per patient health survey noted.  Negative other than as indicated above or in HPI.    Objective:   Vitals:   02/08/22 0804  BP: 126/70  Pulse: 69  Resp: 15  Temp: 98 F (36.7 C)  TempSrc: Temporal  SpO2: 96%  Weight: 197 lb 6.4 oz (89.5 kg)  Height: 6\' 1"  (1.854 m)     Physical Exam Vitals reviewed.  Constitutional:      Appearance: He is well-developed.  HENT:     Head: Normocephalic and atraumatic.     Right Ear: External ear normal.     Left Ear: External ear normal.  Eyes:     Conjunctiva/sclera: Conjunctivae normal.     Pupils: Pupils are equal, round, and reactive to light.  Neck:     Thyroid: No thyromegaly.  Cardiovascular:     Rate and Rhythm: Normal rate and regular rhythm.     Heart sounds: Normal heart sounds.  Pulmonary:     Effort: Pulmonary effort is normal. No respiratory distress.     Breath sounds: Normal breath sounds. No wheezing.  Abdominal:     General: There is no distension.     Palpations: Abdomen is soft.     Tenderness: There is no abdominal tenderness.  Musculoskeletal:         General: No tenderness. Normal range of motion.     Cervical back: Normal range of motion and neck supple.  Lymphadenopathy:     Cervical: No cervical adenopathy.  Skin:    General: Skin is warm and dry.  Neurological:     Mental Status: He is alert and oriented to person, place, and time.     Deep Tendon Reflexes: Reflexes are normal and symmetric.  Psychiatric:        Mood and Affect: Mood normal.        Behavior: Behavior normal.       Assessment & Plan:  SAILOR HAUGHN is a  46 y.o. male . Annual physical exam - Plan: Comprehensive metabolic panel, Lipid panel, Hemoglobin A1c, PSA  - -anticipatory guidance as below in AVS, screening labs above. Health maintenance items as above in HPI discussed/recommended as applicable.   Essential hypertension - Plan: Comprehensive metabolic panel, lisinopril (ZESTRIL) 20 MG tablet  -  Stable, tolerating current regimen. Medications refilled. Labs pending as above.   Hyperlipidemia, unspecified hyperlipidemia type - Plan: Lipid panel  - commended on diet/exercise change and plan to increase with resistance exercise. Check labs.   Screening for prostate cancer - Plan: PSA  Screening for diabetes mellitus - Plan: Hemoglobin A1c History of prediabetes - Plan: Hemoglobin A1c  -Continue exercise, positive diet changes.  Anticipate A1c will improve.  Routine screening for STI (sexually transmitted infection) - Plan: HIV Antibody (routine testing w rflx), RPR, Urine cytology ancillary only.    Meds ordered this encounter  Medications   lisinopril (ZESTRIL) 20 MG tablet    Sig: TAKE 1 TABLET BY MOUTH EVERY DAY.    Dispense:  90 tablet    Refill:  2   There are no Patient Instructions on file for this visit.    Signed,   [name], MD [facility], [facility] 02/08/22 8:35 AM

## 2022-02-09 LAB — RPR: RPR Ser Ql: NONREACTIVE

## 2022-02-09 LAB — HIV ANTIBODY (ROUTINE TESTING W REFLEX): HIV 1&2 Ab, 4th Generation: NONREACTIVE

## 2022-08-08 ENCOUNTER — Encounter: Payer: Self-pay | Admitting: Family Medicine

## 2022-08-08 ENCOUNTER — Ambulatory Visit (INDEPENDENT_AMBULATORY_CARE_PROVIDER_SITE_OTHER): Payer: Managed Care, Other (non HMO) | Admitting: Family Medicine

## 2022-08-08 VITALS — BP 138/78 | HR 72 | Temp 97.8°F | Resp 18 | Ht 73.0 in | Wt 194.4 lb

## 2022-08-08 DIAGNOSIS — Z87898 Personal history of other specified conditions: Secondary | ICD-10-CM

## 2022-08-08 DIAGNOSIS — G4700 Insomnia, unspecified: Secondary | ICD-10-CM

## 2022-08-08 DIAGNOSIS — E785 Hyperlipidemia, unspecified: Secondary | ICD-10-CM

## 2022-08-08 DIAGNOSIS — I10 Essential (primary) hypertension: Secondary | ICD-10-CM | POA: Diagnosis not present

## 2022-08-08 LAB — COMPREHENSIVE METABOLIC PANEL
ALT: 26 U/L (ref 0–53)
AST: 27 U/L (ref 0–37)
Albumin: 4.5 g/dL (ref 3.5–5.2)
Alkaline Phosphatase: 65 U/L (ref 39–117)
BUN: 16 mg/dL (ref 6–23)
CO2: 29 mEq/L (ref 19–32)
Calcium: 9.8 mg/dL (ref 8.4–10.5)
Chloride: 98 mEq/L (ref 96–112)
Creatinine, Ser: 1.23 mg/dL (ref 0.40–1.50)
GFR: 71.6 mL/min (ref >60.00)
Glucose, Bld: 84 mg/dL (ref 70–99)
Potassium: 4.2 mEq/L (ref 3.5–5.1)
Sodium: 136 mEq/L (ref 135–145)
Total Bilirubin: 0.5 mg/dL (ref 0.2–1.2)
Total Protein: 7.6 g/dL (ref 6.0–8.3)

## 2022-08-08 LAB — LIPID PANEL
Cholesterol: 228 mg/dL — ABNORMAL HIGH (ref 0–200)
HDL: 49.8 mg/dL (ref >39.00)
LDL Cholesterol: 163 mg/dL — ABNORMAL HIGH (ref 0–99)
NonHDL: 178.35
Total CHOL/HDL Ratio: 5
Triglycerides: 78 mg/dL (ref 0.0–149.0)
VLDL: 15.6 mg/dL (ref 0.0–40.0)

## 2022-08-08 LAB — HEMOGLOBIN A1C: Hgb A1c MFr Bld: 5.9 % (ref 4.6–6.5)

## 2022-08-08 MED ORDER — HYDROXYZINE HCL 25 MG PO TABS: 12.5000 mg | ORAL_TABLET | Freq: Every evening | ORAL | 1 refills | Status: DC | PRN

## 2022-08-08 MED ORDER — LISINOPRIL 20 MG PO TABS: ORAL_TABLET | ORAL | 2 refills | Status: DC

## 2022-08-08 NOTE — Progress Notes (Signed)
Subjective:  Patient ID: Jerry Johnston, male    DOB: [birth year]  Age: 46 y.o. MRN: [id number]  CC:  Chief Complaint  Patient presents with   Hyperlipidemia   Hypertension    HPI Jerry Johnston presents for   Hypertension: Lisinopril 20 mg daily Rare need for hydroxyzine for sleep. Home readings: 120-130/70-80.  Exercising more. 2 miles walk/run.  Doing well. 5 years in same relationship. Expecting baby boy in February.  BP Readings from Last 3 Encounters:  08/08/22 138/78  02/08/22 126/70  08/03/21 118/78   Lab Results  Component Value Date   CREATININE 1.21 02/08/2022   Hyperlipidemia: Diet/exercise approach previously.  Increased walking, less sugar, better breakfast discussed in February.  No family history of early CAD.  Lipids were improving at last visit. Fasting today.   The 10-year ASCVD risk score (Arnett DK, et al., 2019) is: 7.7%   Values used to calculate the score:     Age: 29 years     Sex: Male     Is Non-Hispanic African American: Yes     Diabetic: No     Tobacco smoker: No     Systolic Blood Pressure: 0000000 mmHg     Is BP treated: Yes     HDL Cholesterol: 47.7 mg/dL     Total Cholesterol: 216 mg/dL  Lab Results  Component Value Date   CHOL 216 (H) 02/08/2022   HDL 47.70 02/08/2022   LDLCALC 144 (H) 02/08/2022   TRIG 120.0 02/08/2022   CHOLHDL 5 02/08/2022   Lab Results  Component Value Date   ALT 26 02/08/2022   AST 25 02/08/2022   ALKPHOS 65 02/08/2022   BILITOT 0.9 02/08/2022   Prediabetes: Improved A1c in February with positive health changes above. Few pound decrease in weight.  Cut back on sweet snacks usually. Lab Results  Component Value Date   HGBA1C 5.7 02/08/2022   Wt Readings from Last 3 Encounters:  08/08/22 194 lb 6.4 oz (88.2 kg)  02/08/22 197 lb 6.4 oz (89.5 kg)  08/03/21 200 lb 12.8 oz (91.1 kg)     History Patient Active Problem List   Diagnosis Date Noted   Essential hypertension 12/19/2017   Drug  allergy 03/31/2013   Past Medical History:  Diagnosis Date   Hypertension    Past Surgical History:  Procedure Laterality Date   WRIST SURGERY     Allergies  Allergen Reactions   Percocet [Oxycodone-Acetaminophen]     ? Reaction pt stated he had broken out during the time of taking med.   Prior to Admission medications   Medication Sig Start Date End Date Taking? Authorizing Provider  hydrOXYzine (ATARAX/VISTARIL) 25 MG tablet TAKE 0.5-1 TABLETS (12.5-25 MG TOTAL) BY MOUTH AT BEDTIME AS NEEDED. 08/25/21  Yes Wendie Agreste, MD  lisinopril (ZESTRIL) 20 MG tablet TAKE 1 TABLET BY MOUTH EVERY DAY. 02/08/22  Yes Wendie Agreste, MD   Social History   Socioeconomic History   Marital status: Single    Spouse name: Not on file   Number of children: Not on file   Years of education: Not on file   Highest education level: Not on file  Occupational History   Occupation: Materials engineer  Tobacco Use   Smoking status: Never   Smokeless tobacco: Never  Substance and Sexual Activity   Alcohol use: Yes    Alcohol/week: 2.0 - 3.0 standard drinks of alcohol    Types: 2 - 3 Standard drinks or equivalent per  week   Drug use: No   Sexual activity: Yes    Birth control/protection: Condom  Other Topics Concern   Not on file  Social History Narrative   Single. Education: [employer].   Exercise: Yes   Social Determinants of Health   Financial Resource Strain: Not on file  Food Insecurity: Not on file  Transportation Needs: Not on file  Physical Activity: Not on file  Stress: Not on file  Social Connections: Not on file  Intimate Partner Violence: Not on file    Review of Systems  Constitutional:  Negative for fatigue and unexpected weight change.  Eyes:  Negative for visual disturbance.  Respiratory:  Negative for cough, chest tightness and shortness of breath.   Cardiovascular:  Negative for chest pain, palpitations and leg swelling.  Gastrointestinal:  Negative for abdominal pain  and blood in stool.  Neurological:  Negative for dizziness, light-headedness and headaches.     Objective:   Vitals:   08/08/22 0832  BP: 138/78  Pulse: 72  Resp: 18  Temp: 97.8 F (36.6 C)  SpO2: 97%  Weight: 194 lb 6.4 oz (88.2 kg)  Height: 6\' 1"  (1.854 m)     Physical Exam Vitals reviewed.  Constitutional:      Appearance: He is well-developed.  HENT:     Head: Normocephalic and atraumatic.  Neck:     Vascular: No carotid bruit or JVD.  Cardiovascular:     Rate and Rhythm: Normal rate and regular rhythm.     Heart sounds: Normal heart sounds. No murmur heard. Pulmonary:     Effort: Pulmonary effort is normal.     Breath sounds: Normal breath sounds. No rales.  Musculoskeletal:     Right lower leg: No edema.     Left lower leg: No edema.  Skin:    General: Skin is warm and dry.  Neurological:     Mental Status: He is alert and oriented to person, place, and time.  Psychiatric:        Mood and Affect: Mood normal.        Assessment & Plan:  Jerry Johnston is a 46 y.o. male . Essential hypertension - Plan: lisinopril (ZESTRIL) 20 MG tablet, Comprehensive metabolic panel  -  Stable, tolerating current regimen. Medications refilled. Labs pending as above.   Insomnia, unspecified type - Plan: hydrOXYzine (ATARAX) 25 MG tablet  -Has hydroxyzine as needed, refilled.  Intermittent dosing.  Exercise in the morning if early wakening.  No changes for now   History of prediabetes - Plan: Hemoglobin A1c  -Borderline A1c but anticipate that will be improved with weight loss and healthier eating, activity.  Commended on his efforts.  Hyperlipidemia, unspecified hyperlipidemia type - Plan: Lipid panel  -Improved in February.  Based on ASCVD risk score and lack of family history of cardiac disease we will hold on meds for now.  Check labs.  Meds ordered this encounter  Medications   hydrOXYzine (ATARAX) 25 MG tablet    Sig: Take 0.5-1 tablets (12.5-25 mg total) by  mouth at bedtime as needed.    Dispense:  90 tablet    Refill:  1   lisinopril (ZESTRIL) 20 MG tablet    Sig: TAKE 1 TABLET BY MOUTH EVERY DAY.    Dispense:  90 tablet    Refill:  2   Patient Instructions  Place recommended today.  No medication changes for now.  If any concerns on labs I will let you know.  I  will see you in 9 months for a physical but please let me know if there are questions in the meantime.    Signed,   [name], MD [facility], [facility] 08/08/22 9:15 AM

## 2022-08-08 NOTE — Patient Instructions (Signed)
Place recommended today.  No medication changes for now.  If any concerns on labs I will let you know.  I will see you in 9 months for a physical but please let me know if there are questions in the meantime.

## 2022-11-01 ENCOUNTER — Encounter: Payer: Managed Care, Other (non HMO) | Admitting: Family Medicine

## 2022-11-05 ENCOUNTER — Encounter: Payer: Managed Care, Other (non HMO) | Admitting: Family Medicine

## 2023-01-17 NOTE — Progress Notes (Signed)
This encounter was created in error - please disregard.

## 2023-05-01 ENCOUNTER — Encounter: Payer: Managed Care, Other (non HMO) | Admitting: Family Medicine

## 2023-05-15 ENCOUNTER — Ambulatory Visit (INDEPENDENT_AMBULATORY_CARE_PROVIDER_SITE_OTHER): Payer: Managed Care, Other (non HMO) | Admitting: Family Medicine

## 2023-05-15 ENCOUNTER — Encounter: Payer: Self-pay | Admitting: Family Medicine

## 2023-05-15 VITALS — BP 130/74 | HR 72 | Temp 98.1°F | Ht 73.0 in | Wt 206.6 lb

## 2023-05-15 DIAGNOSIS — Z1211 Encounter for screening for malignant neoplasm of colon: Secondary | ICD-10-CM

## 2023-05-15 DIAGNOSIS — Z8042 Family history of malignant neoplasm of prostate: Secondary | ICD-10-CM

## 2023-05-15 DIAGNOSIS — Z125 Encounter for screening for malignant neoplasm of prostate: Secondary | ICD-10-CM | POA: Diagnosis not present

## 2023-05-15 DIAGNOSIS — E785 Hyperlipidemia, unspecified: Secondary | ICD-10-CM

## 2023-05-15 DIAGNOSIS — I10 Essential (primary) hypertension: Secondary | ICD-10-CM

## 2023-05-15 DIAGNOSIS — Z0000 Encounter for general adult medical examination without abnormal findings: Secondary | ICD-10-CM

## 2023-05-15 DIAGNOSIS — Z87898 Personal history of other specified conditions: Secondary | ICD-10-CM

## 2023-05-15 LAB — COMPREHENSIVE METABOLIC PANEL
ALT: 51 U/L (ref 0–53)
AST: 38 U/L — ABNORMAL HIGH (ref 0–37)
Albumin: 4.8 g/dL (ref 3.5–5.2)
Alkaline Phosphatase: 75 U/L (ref 39–117)
BUN: 16 mg/dL (ref 6–23)
CO2: 27 mEq/L (ref 19–32)
Calcium: 9.9 mg/dL (ref 8.4–10.5)
Chloride: 98 mEq/L (ref 96–112)
Creatinine, Ser: 1.26 mg/dL (ref 0.40–1.50)
GFR: 69.19 mL/min (ref >60.00)
Glucose, Bld: 73 mg/dL (ref 70–99)
Potassium: 5 mEq/L (ref 3.5–5.1)
Sodium: 136 mEq/L (ref 135–145)
Total Bilirubin: 0.7 mg/dL (ref 0.2–1.2)
Total Protein: 8.1 g/dL (ref 6.0–8.3)

## 2023-05-15 LAB — HEMOGLOBIN A1C: Hgb A1c MFr Bld: 5.7 % (ref 4.6–6.5)

## 2023-05-15 LAB — LIPID PANEL
Cholesterol: 265 mg/dL — ABNORMAL HIGH (ref 0–200)
HDL: 47.8 mg/dL (ref >39.00)
LDL Cholesterol: 190 mg/dL — ABNORMAL HIGH (ref 0–99)
NonHDL: 217
Total CHOL/HDL Ratio: 6
Triglycerides: 133 mg/dL (ref 0.0–149.0)
VLDL: 26.6 mg/dL (ref 0.0–40.0)

## 2023-05-15 LAB — PSA: PSA: 4.05 ng/mL — ABNORMAL HIGH (ref 0.10–4.00)

## 2023-05-15 NOTE — Patient Instructions (Addendum)
Congrats on the new baby boy!  Flu and covid booster in the fall.   I will check some screening labs including prostate test, and will let you know if any concerns.  Improving diet and exercise should help if those readings are up today.   Please follow-up if there are any other concerns we did not have a chance to address today, otherwise I will see you in 6 months.  Take care.   Preventive Care 36-45 Years Old, Male Preventive care refers to lifestyle choices and visits with your health care provider that can promote health and wellness. Preventive care visits are also called wellness exams. What can I expect for my preventive care visit? Counseling During your preventive care visit, your health care provider may ask about your: Medical history, including: Past medical problems. Family medical history. Current health, including: Emotional well-being. Home life and relationship well-being. Sexual activity. Lifestyle, including: Alcohol, nicotine or tobacco, and drug use. Access to firearms. Diet, exercise, and sleep habits. Safety issues such as seatbelt and bike helmet use. Sunscreen use. Work and work Astronomer. Physical exam Your health care provider will check your: Height and weight. These may be used to calculate your BMI (body mass index). BMI is a measurement that tells if you are at a healthy weight. Waist circumference. This measures the distance around your waistline. This measurement also tells if you are at a healthy weight and may help predict your risk of certain diseases, such as type 2 diabetes and high blood pressure. Heart rate and blood pressure. Body temperature. Skin for abnormal spots. What immunizations do I need?  Vaccines are usually given at various ages, according to a schedule. Your health care provider will recommend vaccines for you based on your age, medical history, and lifestyle or other factors, such as travel or where you work. What tests do  I need? Screening Your health care provider may recommend screening tests for certain conditions. This may include: Lipid and cholesterol levels. Diabetes screening. This is done by checking your blood sugar (glucose) after you have not eaten for a while (fasting). Hepatitis B test. Hepatitis C test. HIV (human immunodeficiency virus) test. STI (sexually transmitted infection) testing, if you are at risk. Lung cancer screening. Prostate cancer screening. Colorectal cancer screening. Talk with your health care provider about your test results, treatment options, and if necessary, the need for more tests. Follow these instructions at home: Eating and drinking  Eat a diet that includes fresh fruits and vegetables, whole grains, lean protein, and low-fat dairy products. Take vitamin and mineral supplements as recommended by your health care provider. Do not drink alcohol if your health care provider tells you not to drink. If you drink alcohol: Limit how much you have to 0-2 drinks a day. Know how much alcohol is in your drink. In the [place] one drink equals one 12 oz bottle of beer (355 mL), one 5 oz glass of wine (148 mL), or one 1 oz glass of hard liquor (44 mL). Lifestyle Brush your teeth every morning and night with fluoride toothpaste. Floss one time each day. Exercise for at least 30 minutes 5 or more days each week. Do not use any products that contain nicotine or tobacco. These products include cigarettes, chewing tobacco, and vaping devices, such as e-cigarettes. If you need help quitting, ask your health care provider. Do not use drugs. If you are sexually active, practice safe sex. Use a condom or other form of protection to prevent STIs.  Take aspirin only as told by your health care provider. Make sure that you understand how much to take and what form to take. Work with your health care provider to find out whether it is safe and beneficial for you to take aspirin daily. Find  healthy ways to manage stress, such as: Meditation, yoga, or listening to music. Journaling. Talking to a trusted person. Spending time with friends and family. Minimize exposure to UV radiation to reduce your risk of skin cancer. Safety Always wear your seat belt while driving or riding in a vehicle. Do not drive: If you have been drinking alcohol. Do not ride with someone who has been drinking. When you are tired or distracted. While texting. If you have been using any mind-altering substances or drugs. Wear a helmet and other protective equipment during sports activities. If you have firearms in your house, make sure you follow all gun safety procedures. What's next? Go to your health care provider once a year for an annual wellness visit. Ask your health care provider how often you should have your eyes and teeth checked. Stay up to date on all vaccines. This information is not intended to replace advice given to you by your health care provider. Make sure you discuss any questions you have with your health care provider. Document Revised: 05/31/2021 Document Reviewed: 05/31/2021 Elsevier Patient Education  2024 [employer].

## 2023-05-15 NOTE — Progress Notes (Signed)
Subjective:  Patient ID: Jerry Johnston, male    DOB: January 03, 1976  Age: 47 y.o. MRN: [id number]  CC:  Chief Complaint  Patient presents with   Annual Exam    Pt is fasting    HPI LAVALE SENDER presents for Annual Exam  New son born 01/23/23. Has been a little hectic with new son at home. Doing ok with adjusting. Has been managing stress ok.   Hypertension: Lisinopril 20 mg daily, no new side effects, no recent home readings.   BP Readings from Last 3 Encounters:  05/15/23 130/74  08/08/22 138/78  02/08/22 126/70   Lab Results  Component Value Date   CREATININE 1.23 08/08/2022   Hyperlipidemia: Fairly low ASCVD risk score previously, diet/exercise approach planned as no family history of early CAD. The 10-year ASCVD risk score (Arnett DK, et al., 2019) is: 6.9%   Values used to calculate the score:     Age: 54 years     Sex: Male     Is Non-Hispanic African American: Yes     Diabetic: No     Tobacco smoker: No     Systolic Blood Pressure: 130 mmHg     Is BP treated: Yes     HDL Cholesterol: 49.8 mg/dL     Total Cholesterol: 228 mg/dL  Lab Results  Component Value Date   CHOL 228 (H) 08/08/2022   HDL 49.80 08/08/2022   LDLCALC 163 (H) 08/08/2022   TRIG 78.0 08/08/2022   CHOLHDL 5 08/08/2022   Lab Results  Component Value Date   ALT 26 08/08/2022   AST 27 08/08/2022   ALKPHOS 65 08/08/2022   BILITOT 0.5 08/08/2022   Prediabetes: Diet/exercise approach, weight has increased slightly from his last visit in August. New son at home has impacted time of exercise. Some decreased diet adherence - plans improvement with both.   Lab Results  Component Value Date   HGBA1C 5.9 08/08/2022   Wt Readings from Last 3 Encounters:  05/15/23 206 lb 9.6 oz (93.7 kg)  08/08/22 194 lb 6.4 oz (88.2 kg)  02/08/22 197 lb 6.4 oz (89.5 kg)         05/15/2023   10:34 AM 08/08/2022    8:31 AM 02/08/2022    8:07 AM 08/03/2021    8:50 AM 05/03/2021    8:28 AM  Depression  screen PHQ 2/9  Decreased Interest 0 0 0 0 0  Down, Depressed, Hopeless 0 0 0 0 1  PHQ - 2 Score 0 0 0 0 1  Altered sleeping  0 1 0   Tired, decreased energy  0 0 0   Change in appetite  0 0 0   Feeling bad or failure about yourself   0 0 0   Trouble concentrating  2 1 1    Moving slowly or fidgety/restless  0 0 0   Suicidal thoughts  0 0 0   PHQ-9 Score  2 2 1    Overall sleeping ok, only occasional early awakening.managing ok.   Health Maintenance  Topic Date Due   COVID-19 Vaccine (4 - 2023-24 season) 05/31/2023 (Originally 08/17/2022)   INFLUENZA VACCINE  07/18/2023   DTaP/Tdap/Td (3 - Td or Tdap) 06/07/2027   Hepatitis C Screening  Completed   HIV Screening  Completed   HPV VACCINES  Aged Out  Covid infection in February - mild  STI screening - declines.   FH of CA: Father with pancreatic, gastric cancer.  Prostate - maternal GF.  No colon CA in family. Has had BRBPR in past - none in past few months. Noted during stressful time and possible hemorrhoids.  We discussed pros and cons of prostate cancer screening, and after this discussion, he chose to have screening done. PSA obtained  Immunization History  Administered Date(s) Administered   Hepatitis A 03/28/2007, 09/30/2007   Hepatitis B 03/28/2007, 04/28/2007, 09/30/2007, 05/31/2014   Hepatitis B, ADULT 05/31/2014   Influenza Inj Mdck Quad Pf 09/19/2017, 10/11/2022   Influenza Inj Mdck Quad With Preservative 12/20/2018   Influenza Split 09/30/2013, 09/30/2014   Influenza,inj,Quad PF,6+ Mos 12/05/2015, 12/06/2016, 10/05/2019   PFIZER(Purple Top)SARS-COV-2 Vaccination 03/09/2020, 03/30/2020, 05/05/2021   Tdap 03/28/2007, 06/06/2017   No results found. Optho - 2 years follow up this December.   Dental: every 6 months.   Alcohol: less recently. None in past 4 months.   Tobacco: none, no vaping.   Exercise:minimal - plans to increase. Considering under desk treadmill.  Some thumb soreness recently, thinks it may be  related to activity with baby, plans to follow-up if not improving with modification.  No specific injury or trauma.   History Patient Active Problem List   Diagnosis Date Noted   Essential hypertension 12/19/2017   Drug allergy 03/31/2013   Past Medical History:  Diagnosis Date   Hypertension    Past Surgical History:  Procedure Laterality Date   WRIST SURGERY     Allergies  Allergen Reactions   Percocet [Oxycodone-Acetaminophen]     ? Reaction pt stated he had broken out during the time of taking med.   Prior to Admission medications   Medication Sig Start Date End Date Taking? Authorizing Provider  lisinopril (ZESTRIL) 20 MG tablet TAKE 1 TABLET BY MOUTH EVERY DAY. 08/08/22  Yes [name], MD  hydrOXYzine (ATARAX) 25 MG tablet Take 0.5-1 tablets (12.5-25 mg total) by mouth at bedtime as needed. Patient not taking: Reported on 05/15/2023 08/08/22   [name], MD   Social History   Socioeconomic History   Marital status: Single    Spouse name: Not on file   Number of children: Not on file   Years of education: Not on file   Highest education level: Not on file  Occupational History   Occupation: Psychiatric nurse  Tobacco Use   Smoking status: Never   Smokeless tobacco: Never  Substance and Sexual Activity   Alcohol use: Yes    Alcohol/week: 2.0 - 3.0 standard drinks of alcohol    Types: 2 - 3 Standard drinks or equivalent per week   Drug use: No   Sexual activity: Yes    Birth control/protection: Condom  Other Topics Concern   Not on file  Social History Narrative   Single. Education: [employer].   Exercise: Yes   Social Determinants of Health   Financial Resource Strain: Not on file  Food Insecurity: Not on file  Transportation Needs: Not on file  Physical Activity: Not on file  Stress: Not on file  Social Connections: Not on file  Intimate Partner Violence: Not on file    Review of Systems 13 point review of systems per patient health survey  noted.  Negative other than as indicated above or in HPI.    Objective:   Vitals:   05/15/23 1030  BP: 130/74  Pulse: 72  Temp: 98.1 F (36.7 C)  SpO2: 98%  Weight: 206 lb 9.6 oz (93.7 kg)  Height: 6\' 1"  (1.854 m)     Physical Exam Vitals reviewed.  Constitutional:      Appearance: He is well-developed.  HENT:     Head: Normocephalic and atraumatic.     Right Ear: External ear normal.     Left Ear: External ear normal.  Eyes:     Conjunctiva/sclera: Conjunctivae normal.     Pupils: Pupils are equal, round, and reactive to light.  Neck:     Thyroid: No thyromegaly.     Vascular: No carotid bruit or JVD.  Cardiovascular:     Rate and Rhythm: Normal rate and regular rhythm.     Heart sounds: Normal heart sounds. No murmur heard. Pulmonary:     Effort: Pulmonary effort is normal. No respiratory distress.     Breath sounds: Normal breath sounds. No wheezing or rales.  Abdominal:     General: There is no distension.     Palpations: Abdomen is soft.     Tenderness: There is no abdominal tenderness.  Musculoskeletal:        General: No tenderness. Normal range of motion.     Cervical back: Normal range of motion and neck supple.     Right lower leg: No edema.     Left lower leg: No edema.  Lymphadenopathy:     Cervical: No cervical adenopathy.  Skin:    General: Skin is warm and dry.  Neurological:     Mental Status: He is alert and oriented to person, place, and time.     Deep Tendon Reflexes: Reflexes are normal and symmetric.  Psychiatric:        Mood and Affect: Mood normal.        Behavior: Behavior normal.        Assessment & Plan:  JAIDIN SHKOLNIK is a 47 y.o. male . Annual physical exam  Screen for colon cancer - Plan: Ambulatory referral to Gastroenterology  Screening for prostate cancer - Plan: PSA  Family history of prostate cancer - Plan: PSA  Essential hypertension - Plan: Comprehensive metabolic panel  History of prediabetes - Plan:  Hemoglobin A1c  Hyperlipidemia, unspecified hyperlipidemia type - Plan: Comprehensive metabolic panel, Lipid panel  -anticipatory guidance as below in AVS, screening labs above. Health maintenance items as above in HPI discussed/recommended as applicable.  Refer for colonoscopy once he turns age 67. Plan for increased exercise, healthier food choices.  Check labs and if slightly elevated would give him some time to change with dietary modification, lifestyle changes, 13-month follow-up.  No new meds for now, continue lisinopril same dose for hypertension. No orders of the defined types were placed in this encounter.  Patient Instructions  Congrats on the new baby boy!  Flu and covid booster in the fall.   I will check some screening labs including prostate test, and will let you know if any concerns.  Improving diet and exercise should help if those readings are up today.   Please follow-up if there are any other concerns we did not have a chance to address today, otherwise I will see you in 6 months.  Take care.   Preventive Care 27-79 Years Old, Male Preventive care refers to lifestyle choices and visits with your health care provider that can promote health and wellness. Preventive care visits are also called wellness exams. What can I expect for my preventive care visit? Counseling During your preventive care visit, your health care provider may ask about your: Medical history, including: Past medical problems. Family medical history. Current health, including: Emotional well-being. Home life and relationship well-being. Sexual  activity. Lifestyle, including: Alcohol, nicotine or tobacco, and drug use. Access to firearms. Diet, exercise, and sleep habits. Safety issues such as seatbelt and bike helmet use. Sunscreen use. Work and work Astronomer. Physical exam Your health care provider will check your: Height and weight. These may be used to calculate your BMI (body mass  index). BMI is a measurement that tells if you are at a healthy weight. Waist circumference. This measures the distance around your waistline. This measurement also tells if you are at a healthy weight and may help predict your risk of certain diseases, such as type 2 diabetes and high blood pressure. Heart rate and blood pressure. Body temperature. Skin for abnormal spots. What immunizations do I need?  Vaccines are usually given at various ages, according to a schedule. Your health care provider will recommend vaccines for you based on your age, medical history, and lifestyle or other factors, such as travel or where you work. What tests do I need? Screening Your health care provider may recommend screening tests for certain conditions. This may include: Lipid and cholesterol levels. Diabetes screening. This is done by checking your blood sugar (glucose) after you have not eaten for a while (fasting). Hepatitis B test. Hepatitis C test. HIV (human immunodeficiency virus) test. STI (sexually transmitted infection) testing, if you are at risk. Lung cancer screening. Prostate cancer screening. Colorectal cancer screening. Talk with your health care provider about your test results, treatment options, and if necessary, the need for more tests. Follow these instructions at home: Eating and drinking  Eat a diet that includes fresh fruits and vegetables, whole grains, lean protein, and low-fat dairy products. Take vitamin and mineral supplements as recommended by your health care provider. Do not drink alcohol if your health care provider tells you not to drink. If you drink alcohol: Limit how much you have to 0-2 drinks a day. Know how much alcohol is in your drink. In the [place] one drink equals one 12 oz bottle of beer (355 mL), one 5 oz glass of wine (148 mL), or one 1 oz glass of hard liquor (44 mL). Lifestyle Brush your teeth every morning and night with fluoride toothpaste. Floss one  time each day. Exercise for at least 30 minutes 5 or more days each week. Do not use any products that contain nicotine or tobacco. These products include cigarettes, chewing tobacco, and vaping devices, such as e-cigarettes. If you need help quitting, ask your health care provider. Do not use drugs. If you are sexually active, practice safe sex. Use a condom or other form of protection to prevent STIs. Take aspirin only as told by your health care provider. Make sure that you understand how much to take and what form to take. Work with your health care provider to find out whether it is safe and beneficial for you to take aspirin daily. Find healthy ways to manage stress, such as: Meditation, yoga, or listening to music. Journaling. Talking to a trusted person. Spending time with friends and family. Minimize exposure to UV radiation to reduce your risk of skin cancer. Safety Always wear your seat belt while driving or riding in a vehicle. Do not drive: If you have been drinking alcohol. Do not ride with someone who has been drinking. When you are tired or distracted. While texting. If you have been using any mind-altering substances or drugs. Wear a helmet and other protective equipment during sports activities. If you have firearms in your house, make sure you follow  all gun safety procedures. What's next? Go to your health care provider once a year for an annual wellness visit. Ask your health care provider how often you should have your eyes and teeth checked. Stay up to date on all vaccines. This information is not intended to replace advice given to you by your health care provider. Make sure you discuss any questions you have with your health care provider. Document Revised: 05/31/2021 Document Reviewed: 05/31/2021 Elsevier Patient Education  2024 Elsevier Inc.        Signed,   [name], MD  Primary Care, [facility] 05/15/23 11:31 AM

## 2023-05-17 ENCOUNTER — Encounter: Payer: Self-pay | Admitting: Family Medicine

## 2023-05-17 NOTE — Telephone Encounter (Signed)
Replied on lab note.

## 2023-05-17 NOTE — Telephone Encounter (Signed)
Pt has question about PSA level when you do get the chance to review them

## 2023-05-27 ENCOUNTER — Ambulatory Visit (INDEPENDENT_AMBULATORY_CARE_PROVIDER_SITE_OTHER): Payer: Managed Care, Other (non HMO) | Admitting: Family Medicine

## 2023-05-27 ENCOUNTER — Encounter: Payer: Self-pay | Admitting: Family Medicine

## 2023-05-27 VITALS — BP 134/72 | HR 68 | Temp 98.0°F | Ht 73.0 in | Wt 209.2 lb

## 2023-05-27 DIAGNOSIS — R972 Elevated prostate specific antigen [PSA]: Secondary | ICD-10-CM | POA: Diagnosis not present

## 2023-05-27 DIAGNOSIS — R351 Nocturia: Secondary | ICD-10-CM

## 2023-05-27 LAB — PSA: PSA: 1.61 ng/mL (ref 0.10–4.00)

## 2023-05-27 NOTE — Progress Notes (Signed)
Subjective:  Patient ID: Jerry Johnston, male    DOB: [birth year]  Age: 47 y.o. MRN: [id number]  CC:  Chief Complaint  Patient presents with   Follow-up    HPI DECIMUS GLUCKMAN presents for   Elevated PSA PSA 4.05 on May 29.  Increased from previous reading in February 2023 of 1.19.  Family history of prostate cancer in maternal grandfather. At last visit had intercourse day or two prior.  No dysuria/frequency/urgency/hematuria. Nocturia up to 2-3 times per night. Noted past few months - up with baby, then voids.  Less if fluid restrict after 8pm - no episodes of nocturia.  No abd pain, back pain, weight loss or night sweats.  AST mildly elevated at 38, but had decreased exercise, plan for increased exercise.  Lipids also slightly elevated and again plan for increased exercise with repeat testing in the next 6 months. A1c also borderline but stable.     History Patient Active Problem List   Diagnosis Date Noted   Essential hypertension 12/19/2017   Drug allergy 03/31/2013   Past Medical History:  Diagnosis Date   Hypertension    Past Surgical History:  Procedure Laterality Date   WRIST SURGERY     Allergies  Allergen Reactions   Percocet [Oxycodone-Acetaminophen]     ? Reaction pt stated he had broken out during the time of taking med.   Prior to Admission medications   Medication Sig Start Date End Date Taking? Authorizing Provider  hydrOXYzine (ATARAX) 25 MG tablet Take 0.5-1 tablets (12.5-25 mg total) by mouth at bedtime as needed. 08/08/22  Yes [name], MD  lisinopril (ZESTRIL) 20 MG tablet TAKE 1 TABLET BY MOUTH EVERY DAY. 08/08/22  Yes [name], MD   Social History   Socioeconomic History   Marital status: Single    Spouse name: Not on file   Number of children: Not on file   Years of education: Not on file   Highest education level: Not on file  Occupational History   Occupation: Psychiatric nurse  Tobacco Use   Smoking status: Never    Smokeless tobacco: Never  Substance and Sexual Activity   Alcohol use: Yes    Alcohol/week: 2.0 - 3.0 standard drinks of alcohol    Types: 2 - 3 Standard drinks or equivalent per week   Drug use: No   Sexual activity: Yes    Birth control/protection: Condom  Other Topics Concern   Not on file  Social History Narrative   Single. Education: [employer].   Exercise: Yes   Social Determinants of Health   Financial Resource Strain: Not on file  Food Insecurity: Not on file  Transportation Needs: Not on file  Physical Activity: Not on file  Stress: Not on file  Social Connections: Not on file  Intimate Partner Violence: Not on file    Review of Systems   Objective:   Vitals:   05/27/23 0906  BP: 134/72  Pulse: 68  Temp: 98 F (36.7 C)  TempSrc: Temporal  SpO2: 99%  Weight: 209 lb 3.2 oz (94.9 kg)  Height: 6\' 1"  (1.854 m)     Physical Exam Constitutional:      General: He is not in acute distress.    Appearance: Normal appearance. He is well-developed.  HENT:     Head: Normocephalic and atraumatic.  Cardiovascular:     Rate and Rhythm: Normal rate.  Pulmonary:     Effort: Pulmonary effort is normal.  Neurological:  Mental Status: He is alert and oriented to person, place, and time.  Psychiatric:        Mood and Affect: Mood normal.        Assessment & Plan:  Jerry Johnston is a 48 y.o. male . Elevated PSA - Plan: PSA  Nocturia - Plan: PSA Elevated PSA, just over normal but up from prior baseline.  Family history of prostate cancer in grandfather.  Denies new symptoms other than some nocturia but that also improves with fluid avoidance before bedtime.  May be related to waking up with young child at home and voiding at that time.  DRE initially deferred today depending on PSA results, and if persistent elevation will refer to urology, likely further testing and DRE at that time.  If persistent nocturia would also recommend urology eval.  Plan to be  determined by PSA results.  No orders of the defined types were placed in this encounter.  Patient Instructions  I will repeat the PSA test today.  If it is still on the higher side of normal I we will refer you to urology to discuss other testing.  If the nighttime urination continues, especially with restricting fluids at that time, I would also have you meet with urology.  No changes for now.  Can recheck the other labs in the next 6 months.  Please let me know if you have questions or any new symptoms.    Signed,   [name], MD [place] [facility], [facility] 05/27/23 10:13 AM

## 2023-05-27 NOTE — Patient Instructions (Signed)
I will repeat the PSA test today.  If it is still on the higher side of normal I we will refer you to urology to discuss other testing.  If the nighttime urination continues, especially with restricting fluids at that time, I would also have you meet with urology.  No changes for now.  Can recheck the other labs in the next 6 months.  Please let me know if you have questions or any new symptoms.

## 2023-05-31 ENCOUNTER — Ambulatory Visit: Payer: Managed Care, Other (non HMO) | Admitting: Family Medicine

## 2023-07-02 ENCOUNTER — Encounter: Payer: Self-pay | Admitting: Gastroenterology

## 2023-07-02 ENCOUNTER — Ambulatory Visit (AMBULATORY_SURGERY_CENTER): Payer: Managed Care, Other (non HMO)

## 2023-07-02 VITALS — Ht 73.0 in | Wt 200.0 lb

## 2023-07-02 DIAGNOSIS — Z1211 Encounter for screening for malignant neoplasm of colon: Secondary | ICD-10-CM

## 2023-07-02 MED ORDER — NA SULFATE-K SULFATE-MG SULF 17.5-3.13-1.6 GM/177ML PO SOLN: 1.0000 | Freq: Once | ORAL | 0 refills | Status: AC

## 2023-07-02 NOTE — Progress Notes (Signed)
No egg or soy allergy known to patient  No issues known to pt with past sedation with any surgeries or procedures Patient denies ever being told they had issues or difficulty with intubation  No FH of Malignant Hyperthermia Pt is not on diet pills Pt is not on  home 02  Pt is not on blood thinners  Pt denies issues with constipation  No A fib or A flutter Have any cardiac testing pending--no Pt instructed to use Singlecare.com or GoodRx for a price reduction on prep  Patient's chart reviewed by John Nulty CNRA prior to previsit and patient appropriate for the [facility].  Previsit completed and red dot placed by patient's name on their procedure day (on provider's schedule).   Can ambulate independently 

## 2023-07-15 ENCOUNTER — Encounter: Payer: Self-pay | Admitting: Gastroenterology

## 2023-07-15 ENCOUNTER — Ambulatory Visit: Payer: Managed Care, Other (non HMO) | Admitting: Gastroenterology

## 2023-07-15 VITALS — BP 133/84 | HR 74 | Temp 98.3°F | Resp 13 | Ht 73.0 in | Wt 200.0 lb

## 2023-07-15 DIAGNOSIS — K635 Polyp of colon: Secondary | ICD-10-CM

## 2023-07-15 DIAGNOSIS — Z1211 Encounter for screening for malignant neoplasm of colon: Secondary | ICD-10-CM

## 2023-07-15 DIAGNOSIS — D125 Benign neoplasm of sigmoid colon: Secondary | ICD-10-CM

## 2023-07-15 MED ORDER — SODIUM CHLORIDE 0.9 % IV SOLN: 500.0000 mL | Freq: Once | INTRAVENOUS | Status: DC

## 2023-07-15 NOTE — Progress Notes (Signed)
Report to PACU, RN, vss, BBS= Clear.  

## 2023-07-15 NOTE — Patient Instructions (Addendum)
Recommendation:           - Patient has a contact number available for                            emergencies. The signs and symptoms of potential                            delayed complications were discussed with the                            patient. Return to normal activities tomorrow.                            Written discharge instructions were provided to the                            patient.                           - High fiber diet.                           - Continue present medications.                           - Await pathology results.                           - Repeat colonoscopy for surveillance based on                            pathology results.                           - Preparation H ointment: Apply externally BID for                            10 days PRN. If still with anorectal problems, he                            needs to get in touch with [place].                           - The findings and recommendations were discussed                            with the patient's family.  Handouts on High fiber diet, hemorrhoids and polyps given.  YOU HAD AN ENDOSCOPIC PROCEDURE TODAY AT THE [facility]:   Refer to the procedure report that was given to you for any specific questions about what was found during the examination.  If the procedure report does not answer your questions, please call your gastroenterologist to clarify.  If you requested that your care partner not be given the details of your procedure findings, then the procedure report has been included in a sealed envelope for you to review at your convenience later.  YOU  SHOULD EXPECT: Some feelings of bloating in the abdomen. Passage of more gas than usual.  Walking can help get rid of the air that was put into your GI tract during the procedure and reduce the bloating. If you had a lower endoscopy (such as a colonoscopy or flexible sigmoidoscopy) you may notice spotting of blood in your stool  or on the toilet paper. If you underwent a bowel prep for your procedure, you may not have a normal bowel movement for a few days.  Please Note:  You might notice some irritation and congestion in your nose or some drainage.  This is from the oxygen used during your procedure.  There is no need for concern and it should clear up in a day or so.  SYMPTOMS TO REPORT IMMEDIATELY:  Following lower endoscopy (colonoscopy or flexible sigmoidoscopy):  Excessive amounts of blood in the stool  Significant tenderness or worsening of abdominal pains  Swelling of the abdomen that is new, acute  Fever of 100F or higher  For urgent or emergent issues, a gastroenterologist can be reached at any hour by calling [phone]. Do not use MyChart messaging for urgent concerns.    DIET:  We do recommend a small meal at first, but then you may proceed to your regular diet.  Drink plenty of fluids but you should avoid alcoholic beverages for 24 hours.  ACTIVITY:  You should plan to take it easy for the rest of today and you should NOT DRIVE or use heavy machinery until tomorrow (because of the sedation medicines used during the test).    FOLLOW UP: Our staff will call the number listed on your records the next business day following your procedure.  We will call around 7:15- 8:00 am to check on you and address any questions or concerns that you may have regarding the information given to you following your procedure. If we do not reach you, we will leave a message.     If any biopsies were taken you will be contacted by phone or by letter within the next 1-3 weeks.  Please call us at [phone] if you have not heard about the biopsies in 3 weeks.    SIGNATURES/CONFIDENTIALITY: You and/or your care partner have signed paperwork which will be entered into your electronic medical record.  These signatures attest to the fact that that the information above on your After Visit Summary has been reviewed and is  understood.  Full responsibility of the confidentiality of this discharge information lies with you and/or your care-partner.

## 2023-07-15 NOTE — Op Note (Signed)
[facility] Patient Name: Jerry Johnston Procedure Date: 07/15/2023 8:26 AM MRN: [id number] Endoscopist: Lynann Bologna , MD, [id number] Age: 47 Referring MD:  Date of Birth: 1976-04-06 Gender: Male Account #: [id number] Procedure:                Colonoscopy Indications:              Screening for colorectal malignant neoplasm Medicines:                Monitored Anesthesia Care Procedure:                Pre-Anesthesia Assessment:                           - Prior to the procedure, a History and Physical                            was performed, and patient medications and                            allergies were reviewed. The patient's tolerance of                            previous anesthesia was also reviewed. The risks                            and benefits of the procedure and the sedation                            options and risks were discussed with the patient.                            All questions were answered, and informed consent                            was obtained. Prior Anticoagulants: The patient has                            taken no anticoagulant or antiplatelet agents. ASA                            Grade Assessment: I - A normal, healthy patient.                            After reviewing the risks and benefits, the patient                            was deemed in satisfactory condition to undergo the                            procedure.                           After obtaining informed consent, the colonoscope  was passed under direct vision. Throughout the                            procedure, the patient's blood pressure, pulse, and                            oxygen saturations were monitored continuously. The                            CF [id number] #[id number] was introduced through the anus                            and advanced to the 2 cm into the ileum. The                            colonoscopy was performed  without difficulty. The                            patient tolerated the procedure well. The quality                            of the bowel preparation was good. The terminal                            ileum, ileocecal valve, appendiceal orifice, and                            rectum were photographed. Scope In: 8:36:42 AM Scope Out: 8:48:36 AM Scope Withdrawal Time: 0 hours 9 minutes 24 seconds  Total Procedure Duration: 0 hours 11 minutes 54 seconds  Findings:                 A 4 mm polyp was found in the mid sigmoid colon.                            The polyp was sessile. The polyp was removed with a                            cold snare. Resection and retrieval were complete.                           Non-bleeding external and internal hemorrhoids were                            found during retroflexion and during perianal exam.                            The hemorrhoids were small and Grade I (internal                            hemorrhoids that do not prolapse).                           The terminal  ileum appeared normal.                           The exam was otherwise without abnormality on                            direct and retroflexion views. Complications:            No immediate complications. Estimated Blood Loss:     Estimated blood loss: none. Impression:               - One 4 mm polyp in the mid sigmoid colon, removed                            with a cold snare. Resected and retrieved.                           - Non-bleeding external and internal hemorrhoids.                           - The examined portion of the ileum was normal.                           - The examination was otherwise normal on direct                            and retroflexion views. Recommendation:           - Patient has a contact number available for                            emergencies. The signs and symptoms of potential                            delayed complications were discussed  with the                            patient. Return to normal activities tomorrow.                            Written discharge instructions were provided to the                            patient.                           - High fiber diet.                           - Continue present medications.                           - Await pathology results.                           - Repeat colonoscopy for surveillance based on  pathology results.                           - Preparation H ointment: Apply externally BID for                            10 days PRN. If still with anorectal problems, he                            needs to get in touch with [place].                           - The findings and recommendations were discussed                            with the patient's family. Lynann Bologna, MD 07/15/2023 8:52:56 AM This report has been signed electronically.

## 2023-07-15 NOTE — Progress Notes (Signed)
Called to room to assist during endoscopic procedure.  Patient ID and intended procedure confirmed with present staff. Received instructions for my participation in the procedure from the performing physician.  

## 2023-07-16 ENCOUNTER — Telehealth: Payer: Self-pay

## 2023-07-16 NOTE — Telephone Encounter (Signed)
  Follow up Call-     07/15/2023    7:34 AM  Call back number  Post procedure Call Back phone  # [phone]  Permission to leave phone message Yes     Patient questions:  Do you have a fever, pain , or abdominal swelling? No. Pain Score  0 *  Have you tolerated food without any problems? Yes.    Have you been able to return to your normal activities? Yes.    Do you have any questions about your discharge instructions: Diet   No. Medications  No. Follow up visit  No.  Do you have questions or concerns about your Care? No.  Actions: * If pain score is 4 or above: No action needed, pain <4.

## 2023-07-18 ENCOUNTER — Encounter: Payer: Self-pay | Admitting: Gastroenterology

## 2023-10-10 ENCOUNTER — Encounter: Payer: Self-pay | Admitting: Family Medicine

## 2023-10-10 DIAGNOSIS — R0683 Snoring: Secondary | ICD-10-CM

## 2023-10-10 DIAGNOSIS — G478 Other sleep disorders: Secondary | ICD-10-CM

## 2023-10-10 NOTE — Telephone Encounter (Signed)
Pt is requesting sleep study as discussed at some previous visits.   Please advise last visit was in June.

## 2023-11-18 ENCOUNTER — Ambulatory Visit: Payer: Managed Care, Other (non HMO) | Admitting: Family Medicine

## 2023-12-19 ENCOUNTER — Ambulatory Visit: Payer: Managed Care, Other (non HMO) | Admitting: Family Medicine

## 2023-12-19 ENCOUNTER — Encounter: Payer: Self-pay | Admitting: Family Medicine

## 2023-12-19 VITALS — BP 118/70 | HR 74 | Temp 98.6°F | Ht 73.0 in | Wt 209.2 lb

## 2023-12-19 DIAGNOSIS — I10 Essential (primary) hypertension: Secondary | ICD-10-CM | POA: Diagnosis not present

## 2023-12-19 DIAGNOSIS — Z87898 Personal history of other specified conditions: Secondary | ICD-10-CM | POA: Diagnosis not present

## 2023-12-19 DIAGNOSIS — E785 Hyperlipidemia, unspecified: Secondary | ICD-10-CM

## 2023-12-19 DIAGNOSIS — Z23 Encounter for immunization: Secondary | ICD-10-CM | POA: Diagnosis not present

## 2023-12-19 LAB — COMPREHENSIVE METABOLIC PANEL
ALT: 33 U/L (ref 0–53)
AST: 32 U/L (ref 0–37)
Albumin: 4.9 g/dL (ref 3.5–5.2)
Alkaline Phosphatase: 87 U/L (ref 39–117)
BUN: 16 mg/dL (ref 6–23)
CO2: 28 meq/L (ref 19–32)
Calcium: 9.9 mg/dL (ref 8.4–10.5)
Chloride: 100 meq/L (ref 96–112)
Creatinine, Ser: 1.25 mg/dL (ref 0.40–1.50)
GFR: 69.56 mL/min (ref >60.00)
Glucose, Bld: 81 mg/dL (ref 70–99)
Potassium: 3.9 meq/L (ref 3.5–5.1)
Sodium: 137 meq/L (ref 135–145)
Total Bilirubin: 0.6 mg/dL (ref 0.2–1.2)
Total Protein: 8.2 g/dL (ref 6.0–8.3)

## 2023-12-19 LAB — LIPID PANEL
Cholesterol: 247 mg/dL — ABNORMAL HIGH (ref 0–200)
HDL: 52.7 mg/dL (ref >39.00)
LDL Cholesterol: 176 mg/dL — ABNORMAL HIGH (ref 0–99)
NonHDL: 193.91
Total CHOL/HDL Ratio: 5
Triglycerides: 91 mg/dL (ref 0.0–149.0)
VLDL: 18.2 mg/dL (ref 0.0–40.0)

## 2023-12-19 LAB — HEMOGLOBIN A1C: Hgb A1c MFr Bld: 6 % (ref 4.6–6.5)

## 2023-12-19 MED ORDER — LISINOPRIL 20 MG PO TABS
ORAL_TABLET | ORAL | 2 refills | Status: DC
Start: 2023-12-19 — End: 2024-06-25

## 2023-12-19 NOTE — Progress Notes (Signed)
 Subjective:  Patient ID: Jerry Johnston, male    DOB: 09/29/1976  Age: 48 y.o. MRN: [id number]  CC:  Chief Complaint  Patient presents with   Medical Management of Chronic Issues    Pt is doing well no concerns     HPI Jerry Johnston presents for   Follow up.  Son [place] is 38mo old. Doing well.   Hypertension: Lisinopril  20 mg every day, no new side effects with meds.  Will be rescheduling sleep study. Home readings:none.  BP Readings from Last 3 Encounters:  12/19/23 118/70  07/15/23 133/84  05/27/23 134/72   Lab Results  Component Value Date   CREATININE 1.26 05/15/2023    Elevated PSA Discussed in June.  Elevated 4.05, repeat testing 1.61, similar to previous range. Normal urination. Rare nocturia. No hematuria, change in stream.   Hyperlipidemia: Diet/exercise approach previously. Has under desk treadmill  - intermittnet use - less than planned. Plans to increase exercise.  The 10-year ASCVD risk score (Arnett DK, et al., 2019) is: 6.5%   Values used to calculate the score:     Age: 36 years     Sex: Male     Is Non-Hispanic African American: Yes     Diabetic: No     Tobacco smoker: No     Systolic Blood Pressure: 118 mmHg     Is BP treated: Yes     HDL Cholesterol: 47.8 mg/dL     Total Cholesterol: 265 mg/dL  Lab Results  Component Value Date   CHOL 265 (H) 05/15/2023   HDL 47.80 05/15/2023   LDLCALC 190 (H) 05/15/2023   TRIG 133.0 05/15/2023   CHOLHDL 6 05/15/2023   Lab Results  Component Value Date   ALT 51 05/15/2023   AST 38 (H) 05/15/2023   ALKPHOS 75 05/15/2023   BILITOT 0.7 05/15/2023   Prediabetes: Barely at prediabetes level in May.  Again diet/exercise approach, plans improved exercise. Minimal sugar beverages. Alcohol - minimal.  Sleeping ok.  Lab Results  Component Value Date   HGBA1C 5.7 05/15/2023   Wt Readings from Last 3 Encounters:  12/19/23 209 lb 3.2 oz (94.9 kg)  07/15/23 200 lb (90.7 kg)  07/02/23 200 lb (90.7  kg)   History Patient Active Problem List   Diagnosis Date Noted   Essential hypertension 12/19/2017   Drug allergy 03/31/2013   Past Medical History:  Diagnosis Date   Hypertension    Past Surgical History:  Procedure Laterality Date   WRIST SURGERY     Allergies  Allergen Reactions   Percocet [Oxycodone-Acetaminophen]     ? Reaction pt stated he had broken out during the time of taking med.   Prior to Admission medications   Medication Sig Start Date End Date Taking? Authorizing Provider  lisinopril  (ZESTRIL ) 20 MG tablet TAKE 1 TABLET BY MOUTH EVERY DAY. 08/08/22  Yes Levora Reyes SAUNDERS, MD  hydrOXYzine  (ATARAX ) 25 MG tablet Take 0.5-1 tablets (12.5-25 mg total) by mouth at bedtime as needed. Patient not taking: Reported on 12/19/2023 08/08/22   Levora Reyes SAUNDERS, MD   Social History   Socioeconomic History   Marital status: Married    Spouse name: Not on file   Number of children: Not on file   Years of education: Not on file   Highest education level: Bachelor's degree (e.g., BA, AB, BS)  Occupational History   Occupation: Psychiatric Nurse  Tobacco Use   Smoking status: Never   Smokeless tobacco: Never  Substance and Sexual Activity   Alcohol use: Yes    Alcohol/week: 2.0 - 3.0 standard drinks of alcohol    Types: 2 - 3 Standard drinks or equivalent per week   Drug use: No   Sexual activity: Yes    Birth control/protection: Condom  Other Topics Concern   Not on file  Social History Narrative   Single. Education: [employer].   Exercise: Yes   Social Drivers of Health   Financial Resource Strain: Low Risk  (12/18/2023)   Overall Financial Resource Strain (CARDIA)    Difficulty of Paying Living Expenses: Not hard at all  Food Insecurity: No Food Insecurity (12/18/2023)   Hunger Vital Sign    Worried About Running Out of Food in the Last Year: Never true    Ran Out of Food in the Last Year: Never true  Transportation Needs: No Transportation Needs (12/18/2023)   PRAPARE -  Administrator, Civil Service (Medical): No    Lack of Transportation (Non-Medical): No  Physical Activity: Insufficiently Active (12/18/2023)   Exercise Vital Sign    Days of Exercise per Week: 2 days    Minutes of Exercise per Session: 20 min  Stress: No Stress Concern Present (12/18/2023)   [employer] of Occupational Health - Occupational Stress Questionnaire    Feeling of Stress : Only a little  Social Connections: Moderately Integrated (12/18/2023)   Social Connection and Isolation Panel [NHANES]    Frequency of Communication with Friends and Family: More than three times a week    Frequency of Social Gatherings with Friends and Family: Once a week    Attends Religious Services: 1 to 4 times per year    Active Member of [employer] or Organizations: No    Attends Engineer, Structural: Not on file    Marital Status: Married  Recent Concern: Social Connections - Moderately Isolated (11/17/2023)   Social Connection and Isolation Panel [NHANES]    Frequency of Communication with Friends and Family: More than three times a week    Frequency of Social Gatherings with Friends and Family: Never    Attends Religious Services: Never    Database Administrator or Organizations: No    Attends Engineer, Structural: Not on file    Marital Status: Married  Catering Manager Violence: Not on file    Review of Systems  Constitutional:  Negative for fatigue and unexpected weight change.  Eyes:  Negative for visual disturbance.  Respiratory:  Negative for cough, chest tightness and shortness of breath.   Cardiovascular:  Negative for chest pain, palpitations and leg swelling.  Gastrointestinal:  Negative for abdominal pain and blood in stool.  Neurological:  Negative for dizziness, light-headedness and headaches.     Objective:   Vitals:   12/19/23 1307  BP: 118/70  Pulse: 74  Temp: 98.6 F (37 C)  TempSrc: Temporal  SpO2: 98%  Weight: 209 lb 3.2 oz (94.9 kg)   Height: 6' 1 (1.854 m)     Physical Exam Vitals reviewed.  Constitutional:      Appearance: He is well-developed.  HENT:     Head: Normocephalic and atraumatic.  Neck:     Vascular: No carotid bruit or JVD.  Cardiovascular:     Rate and Rhythm: Normal rate and regular rhythm.     Heart sounds: Normal heart sounds. No murmur heard. Pulmonary:     Effort: Pulmonary effort is normal.     Breath sounds: Normal breath sounds.  No rales.  Musculoskeletal:     Right lower leg: No edema.     Left lower leg: No edema.  Skin:    General: Skin is warm and dry.  Neurological:     Mental Status: He is alert and oriented to person, place, and time.  Psychiatric:        Mood and Affect: Mood normal.       Assessment & Plan:  ASHELY JOSHUA is a 48 y.o. male . History of prediabetes - Plan: Hemoglobin A1c  -Check A1c, plans on increased exercise, if mild elevation would hold home meds for now.  Recheck 6 months.  Essential hypertension - Plan: lisinopril  (ZESTRIL ) 20 MG tablet, Comprehensive metabolic panel  -Stable on current regimen, check labs, adjust plan accordingly.  Plans on rescheduling sleep study.  Hyperlipidemia, unspecified hyperlipidemia type - Plan: Lipid panel  -Mild elevation with relatively low ASCVD risk score previously.  Repeat labs in this morning but initially diet/exercise approach with planned increases in exercise as above.  Needs flu shot - Plan: Flu vaccine trivalent PF, 6mos and older(Flulaval,Afluria,Fluarix,Fluzone)   Meds ordered this encounter  Medications   lisinopril  (ZESTRIL ) 20 MG tablet    Sig: TAKE 1 TABLET BY MOUTH EVERY DAY.    Dispense:  90 tablet    Refill:  2   Patient Instructions  No med changes at this time.  If any concerns on labs I will let you know but I think if numbers are slightly elevated it would be reasonable to try diet/exercise approach and recheck numbers in 6 months.  Let me know if there are questions and happy new  year!    Signed,   Reyes Pines, MD [facility], [facility] 12/19/23 1:55 PM

## 2023-12-19 NOTE — Patient Instructions (Addendum)
 No med changes at this time.  If any concerns on labs I will let you know but I think if numbers are slightly elevated it would be reasonable to try diet/exercise approach and recheck numbers in 6 months.  Let me know if there are questions and happy new year!

## 2024-03-30 ENCOUNTER — Institutional Professional Consult (permissible substitution): Admitting: Neurology

## 2024-04-14 ENCOUNTER — Ambulatory Visit (INDEPENDENT_AMBULATORY_CARE_PROVIDER_SITE_OTHER): Admitting: Neurology

## 2024-04-14 ENCOUNTER — Encounter: Payer: Self-pay | Admitting: Neurology

## 2024-04-14 VITALS — BP 141/86 | HR 72 | Ht 73.0 in | Wt 206.8 lb

## 2024-04-14 DIAGNOSIS — G4719 Other hypersomnia: Secondary | ICD-10-CM | POA: Diagnosis not present

## 2024-04-14 DIAGNOSIS — E663 Overweight: Secondary | ICD-10-CM

## 2024-04-14 DIAGNOSIS — R0681 Apnea, not elsewhere classified: Secondary | ICD-10-CM

## 2024-04-14 DIAGNOSIS — R0683 Snoring: Secondary | ICD-10-CM

## 2024-04-14 DIAGNOSIS — Z9189 Other specified personal risk factors, not elsewhere classified: Secondary | ICD-10-CM | POA: Diagnosis not present

## 2024-04-14 DIAGNOSIS — Z820 Family history of epilepsy and other diseases of the nervous system: Secondary | ICD-10-CM | POA: Diagnosis not present

## 2024-04-14 NOTE — Progress Notes (Signed)
 Subjective:    Patient ID: Jerry Johnston is a 48 y.o. male.  HPI    Debbra Fairy, MD, PhD [facility] [address] [address] [place], [place] [address]  Dear Dr. Ester Helms,   I saw your patient, Jerry Johnston, upon your kind request in my sleep clinic today for initial consultation of his sleep disorder, in particular, concern for underlying obstructive sleep apnea.  The patient is unaccompanied today.  As you know, [name] is a 48 year old male with an underlying medical history of hypertension, elevated PSA, and overweight state, who reports snoring and excessive daytime somnolence as well as difficulty maintaining sleep.  His wife has noticed occasional apneas while he is asleep.  He has a family history of sleep apnea affecting his parents.  His Epworth sleepiness score is 10 out of 24, fatigue severity score is 27 out of 63.  He lives with his family including wife and 43-month-old son.  He goes to bed around 10 or 10:30 PM and rise time at between 6:30 AM and 7 AM.  He denies nightly nocturia.  His weight has been more or less stable.  He is currently not using hydroxyzine .  He has tried ZzzQuil in the past.  He is a non-smoker and drinks alcohol occasionally, caffeine in the form of espresso, usually once per day.  He has had occasional occultly falling asleep in the past.  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  They have 2 cats in the household, son sleeps in his own room.  They do not watch TV in the bedroom.  His Past Medical History Is Significant For: Past Medical History:  Diagnosis Date   Hypertension     His Past Surgical History Is Significant For: Past Surgical History:  Procedure Laterality Date   WRIST SURGERY      His Family History Is Significant For: Family History  Problem Relation Age of Onset   Hypertension Mother    Hyperlipidemia Mother    Stomach cancer Father    Hypertension Father    Hyperlipidemia  Father    Stroke Father    Cystic fibrosis Sister    Cystic fibrosis Brother    Cancer Maternal Grandmother    Cancer Paternal Grandfather    Colon cancer Neg Hx    Colon polyps Neg Hx    Esophageal cancer Neg Hx    Rectal cancer Neg Hx     His Social History Is Significant For: Social History   Socioeconomic History   Marital status: Married    Spouse name: Not on file   Number of children: 1   Years of education: Not on file   Highest education level: Bachelor's degree (e.g., BA, AB, BS)  Occupational History   Occupation: Psychiatric nurse  Tobacco Use   Smoking status: Never   Smokeless tobacco: Never  Substance and Sexual Activity   Alcohol use: Yes    Alcohol/week: 2.0 - 3.0 standard drinks of alcohol    Types: 2 - 3 Standard drinks or equivalent per week   Drug use: No   Sexual activity: Yes    Birth control/protection: Condom  Other Topics Concern   Not on file  Social History Narrative   Single. Education: [employer].   Exercise: Yes   Social Drivers of Health   Financial Resource Strain: Low Risk  (12/18/2023)   Overall Financial Resource Strain (CARDIA)    Difficulty of Paying Living Expenses: Not hard at  all  Food Insecurity: No Food Insecurity (12/18/2023)   Hunger Vital Sign    Worried About Running Out of Food in the Last Year: Never true    Ran Out of Food in the Last Year: Never true  Transportation Needs: No Transportation Needs (12/18/2023)   PRAPARE - Administrator, Civil Service (Medical): No    Lack of Transportation (Non-Medical): No  Physical Activity: Insufficiently Active (12/18/2023)   Exercise Vital Sign    Days of Exercise per Week: 2 days    Minutes of Exercise per Session: 20 min  Stress: No Stress Concern Present (12/18/2023)   [employer] of Occupational Health - Occupational Stress Questionnaire    Feeling of Stress : Only a little  Social Connections: Moderately Integrated (12/18/2023)   Social Connection and Isolation Panel  [NHANES]    Frequency of Communication with Friends and Family: More than three times a week    Frequency of Social Gatherings with Friends and Family: Once a week    Attends Religious Services: 1 to 4 times per year    Active Member of [employer] or Organizations: No    Attends Engineer, structural: Not on file    Marital Status: Married  Recent Concern: Social Connections - Moderately Isolated (11/17/2023)   Social Connection and Isolation Panel [NHANES]    Frequency of Communication with Friends and Family: More than three times a week    Frequency of Social Gatherings with Friends and Family: Never    Attends Religious Services: Never    Database administrator or Organizations: No    Attends Engineer, structural: Not on file    Marital Status: Married    His Allergies Are:  Allergies  Allergen Reactions   Percocet [Oxycodone-Acetaminophen]     ? Reaction pt stated he had broken out during the time of taking med.  :   His Current Medications Are:  Outpatient Encounter Medications as of 04/14/2024  Medication Sig   hydrOXYzine  (ATARAX ) 25 MG tablet Take 0.5-1 tablets (12.5-25 mg total) by mouth at bedtime as needed.   lisinopril  (ZESTRIL ) 20 MG tablet TAKE 1 TABLET BY MOUTH EVERY DAY.   No facility-administered encounter medications on file as of 04/14/2024.  :   Review of Systems:  Out of a complete 14 point review of systems, all are reviewed and negative with the exception of these symptoms as listed below:  Review of Systems  Neurological:        Room 9 Pt is here Alone. Pt states that lately he doesn't have a problem going to sleep but he has trouble going back to sleep. Pt states that his wife will wake him up because he is snoring too loud. Pt has a history of Sleep Apnea with both of his parents and is concerned that he may have it. ESS 10 FSS 27    Objective:  Neurological Exam  Physical Exam Physical Examination:   Vitals:   04/14/24 1347  BP:  (!) 141/86  Pulse: 72    General Examination: The patient is a very pleasant 48 y.o. male in no acute distress. He appears well-developed and well-nourished and well groomed.   HEENT: Normocephalic, atraumatic, pupils are equal, round and reactive to light, extraocular tracking is good without limitation to gaze excursion or nystagmus noted. Hearing is grossly intact. Face is symmetric with normal facial animation. Speech is clear with no dysarthria noted. There is no hypophonia. There is no lip, neck/head,  jaw or voice tremor. Neck is supple with full range of passive and active motion. There are no carotid bruits on auscultation. Oropharynx exam reveals: mild mouth dryness, good dental hygiene and mild airway crowding, due to smaller airway entry, otherwise benign findings, tonsillar size of about 1-2+, Mallampati class I, minimal overbite noted, neck circumference 15 three-quarter inches.  Tongue protrudes centrally and palate elevates symmetrically.  Chest: Clear to auscultation without wheezing, rhonchi or crackles noted.  Heart: S1+S2+0, regular and normal without murmurs, rubs or gallops noted.   Abdomen: Soft, non-tender and non-distended.  Extremities: There is no pitting edema in the distal lower extremities bilaterally.   Skin: Warm and dry without trophic changes noted.   Musculoskeletal: exam reveals no obvious joint deformities.   Neurologically:  Mental status: The patient is awake, alert and oriented in all 4 spheres. His immediate and remote memory, attention, language skills and fund of knowledge are appropriate. There is no evidence of aphasia, agnosia, apraxia or anomia. Speech is clear with normal prosody and enunciation. Thought process is linear. Mood is normal and affect is normal.  Cranial nerves II - XII are as described above under HEENT exam.  Motor exam: Normal bulk, strength and tone is noted. There is no obvious action or resting tremor.  Fine motor skills and  coordination: grossly intact.  Cerebellar testing: No dysmetria or intention tremor. There is no truncal or gait ataxia.  Sensory exam: intact to light touch in the upper and lower extremities.  Gait, station and balance: He stands easily. No veering to one side is noted. No leaning to one side is noted. Posture is age-appropriate and stance is narrow based. Gait shows normal stride length and normal pace. No problems turning are noted.   Assessment and Plan:   In summary, TERRI KODAMA is a 48 year old male with an underlying medical history of hypertension, elevated PSA, and overweight state, whose history and physical exam are concerning for sleep disordered breathing, particularly obstructive sleep apnea (OSA). A laboratory attended sleep study is typically considered "gold standard" for evaluation of sleep disordered breathing.   I had a long chat with the patient about my findings and the diagnosis of sleep apnea, particularly OSA, its prognosis and treatment options. We talked about medical/conservative treatments, surgical interventions and non-pharmacological approaches for symptom control. I explained, in particular, the risks and ramifications of untreated moderate to severe OSA, especially with respect to developing cardiovascular disease down the road, including congestive heart failure (CHF), difficult to treat hypertension, cardiac arrhythmias (particularly A-fib), neurovascular complications including TIA, stroke and dementia. Even type 2 diabetes has, in part, been linked to untreated OSA. Symptoms of untreated OSA may include (but may not be limited to) daytime sleepiness, nocturia (i.e. frequent nighttime urination), memory problems, mood irritability and suboptimally controlled or worsening mood disorder such as depression and/or anxiety, lack of energy, lack of motivation, physical discomfort, as well as recurrent headaches, especially morning or nocturnal headaches. We talked about  the importance of maintaining a healthy lifestyle and striving for healthy weight. In addition, we talked about the importance of striving for and maintaining good sleep hygiene. I recommended a sleep study at this time. I outlined the differences between a laboratory attended sleep study which is considered more comprehensive and accurate over the option of a home sleep test (HST); the latter may lead to underestimation of sleep disordered breathing in some instances and does not help with diagnosing upper airway resistance syndrome and is not accurate  enough to diagnose primary central sleep apnea typically. I outlined possible surgical and non-surgical treatment options of OSA, including the use of a positive airway pressure (PAP) device (i.e. CPAP, AutoPAP/APAP or BiPAP in certain circumstances), a custom-made dental device (aka oral appliance, which would require a referral to a specialist dentist or orthodontist typically, and is generally speaking not considered for patients with full dentures or edentulous state), upper airway surgical options, such as traditional UPPP (which is not considered a first-line treatment) or the Inspire device (hypoglossal nerve stimulator, which would involve a referral for consultation with an ENT surgeon, after careful selection, following inclusion criteria - also not first-line treatment). I explained the PAP treatment option to the patient in detail, as this is generally considered first-line treatment.  The patient indicated that he would be willing to try PAP therapy, if the need arises. I explained the importance of being compliant with PAP treatment, not only for insurance purposes but primarily to improve patient's symptoms symptoms, and for the patient's long term health benefit, including to reduce His cardiovascular risks longer-term.    We will pick up our discussion about the next steps and treatment options after testing.  We will keep him posted as to the  test results by phone call and/or MyChart messaging where possible.  We will plan to follow-up in sleep clinic accordingly as well.  I answered all his questions today and the patient was in agreement.   I encouraged him to call with any interim questions, concerns, problems or updates or email us  through MyChart.  Generally speaking, sleep test authorizations may take up to 2 weeks, sometimes less, sometimes longer, the patient is encouraged to get in touch with us  if they do not hear back from the sleep lab staff directly within the next 2 weeks.  Thank you very much for allowing me to participate in the care of this nice patient. If I can be of any further assistance to you please do not hesitate to call me at [phone].  Sincerely,   Debbra Fairy, MD, PhD

## 2024-04-14 NOTE — Patient Instructions (Signed)

## 2024-04-22 ENCOUNTER — Telehealth: Payer: Self-pay | Admitting: Neurology

## 2024-04-22 NOTE — Telephone Encounter (Signed)
 [employer] NPSG pending

## 2024-04-27 NOTE — Telephone Encounter (Signed)
 [employer] denied the NPSG the reason below.  [employer] HST no auth req.

## 2024-05-15 ENCOUNTER — Ambulatory Visit: Admitting: Neurology

## 2024-05-15 DIAGNOSIS — Z9189 Other specified personal risk factors, not elsewhere classified: Secondary | ICD-10-CM

## 2024-05-15 DIAGNOSIS — E663 Overweight: Secondary | ICD-10-CM

## 2024-05-15 DIAGNOSIS — R0681 Apnea, not elsewhere classified: Secondary | ICD-10-CM

## 2024-05-15 DIAGNOSIS — R0683 Snoring: Secondary | ICD-10-CM

## 2024-05-15 DIAGNOSIS — Z820 Family history of epilepsy and other diseases of the nervous system: Secondary | ICD-10-CM

## 2024-05-15 DIAGNOSIS — G4733 Obstructive sleep apnea (adult) (pediatric): Secondary | ICD-10-CM | POA: Diagnosis not present

## 2024-05-15 DIAGNOSIS — G4719 Other hypersomnia: Secondary | ICD-10-CM

## 2024-06-04 NOTE — Progress Notes (Signed)
 See procedure note.

## 2024-06-05 ENCOUNTER — Ambulatory Visit: Payer: Self-pay | Admitting: Neurology

## 2024-06-05 NOTE — Procedures (Signed)
 [facility]  HOME SLEEP TEST (SANSA) REPORT (Mail-Out Device):   STUDY DATE: 05/18/2024  DOB: 1976/10/02  MRN: [id number]  ORDERING CLINICIAN: Debbra Fairy, MD, PhD   REFERRING CLINICIAN: Benjiman Bras, MD   CLINICAL INFORMATION/HISTORY: 48 year old male with an underlying medical history of hypertension, elevated PSA, and overweight state, who reports snoring and excessive daytime somnolence as well as difficulty maintaining sleep. His wife has noticed occasional apneas while he is asleep.   PATIENT'S LAST REPORTED EPWORTH SLEEPINESS SCORE (ESS): 10/24.  BMI (at the time of sleep clinic visit and/or test date): 27.3 kg/m  FINDINGS:   Study Protocol:    The SANSA single-point-of-skin-contact chest-worn sensor - an FDA cleared and DOT approved type 4 home sleep test device - measures eight physiological channels,  including blood oxygen saturation (measured via PPG [photoplethysmography]), EKG-derived heart rate, respiratory effort, chest movement (measured via accelerometer), snoring, body position, and actigraphy. The device is designed to be worn for up to 10 hours per study.   Sleep Summary:   Total Recording Time (hours, min): 9 hours, 14 min  Total Effective Sleep Time (hours, min):  4 hours, 20 min  Sleep Efficiency (%):    62%   Respiratory Indices:   Calculated sAHI (per hour):  3.7/hour         Oxygen Saturation Statistics:    Oxygen Saturation (%) Mean: 97%   Minimum oxygen saturation (%):                 76.6%   O2 Saturation Range (%): 76.6 - 100%   Time below or at 88% saturation: 1 min   Pulse Rate Statistics:   Pulse Mean (bpm):    75/min    Pulse Range (60 - 107/min)   Snoring: Intermittent, mild to moderate  IMPRESSION/DIAGNOSES:   Primary snoring   RECOMMENDATIONS:   This home sleep test does not demonstrate any significant obstructive sleep disordered breathing with a total AHI of less than 5/hour. His total AHI  was 3.7/hour, O2 nadir of 76.6% with significant errors in the oxygen sensor noted and significant wakefulness and low sleep efficiency of only 62%.  Snoring was detected, intermittently in the mild to moderate range. Treatment with a positive airway pressure device such as AutoPap or CPAP is not indicated based on this test. Snoring may improve with avoidance of the supine sleep position and weight loss (where clinically appropriate).   For disturbing snoring, an oral appliance through dentistry or orthodontics can be considered.  Other causes of the patient's symptoms, including circadian rhythm disturbances, an underlying mood disorder, medication effect and/or an underlying medical problem cannot be ruled out based on this test. Clinical correlation is recommended.  The patient should be cautioned not to drive, work at heights, or operate dangerous or heavy equipment when tired or sleepy. Review and reiteration of good sleep hygiene measures should be pursued with any patient. The patient will be advised to follow up with his referring provider, who will be notified of the test results.   I certify that I have reviewed the raw data recording prior to the issuance of this report in accordance with the standards of the American Academy of Sleep Medicine (AASM).    INTERPRETING PHYSICIAN:   Debbra Fairy, MD, PhD Medical Director, [facility]) Diplomat, ABPN (Neurology and Sleep)   [facility] [address] [place], [place] [address] [phone]

## 2024-06-08 NOTE — Telephone Encounter (Signed)
 Noted

## 2024-06-08 NOTE — Telephone Encounter (Signed)
-----   Message from True Mar sent at 06/05/2024  2:57 PM EDT ----- See MyChart message to patient, FYI.  ----- Message ----- From: Mar True, MD Sent: 06/05/2024   2:56 PM EDT To: True Mar, MD

## 2024-06-25 ENCOUNTER — Ambulatory Visit (INDEPENDENT_AMBULATORY_CARE_PROVIDER_SITE_OTHER): Payer: Managed Care, Other (non HMO) | Admitting: Family Medicine

## 2024-06-25 ENCOUNTER — Encounter: Payer: Self-pay | Admitting: Family Medicine

## 2024-06-25 VITALS — BP 116/68 | HR 77 | Temp 97.8°F | Resp 12 | Ht 72.25 in | Wt 201.8 lb

## 2024-06-25 DIAGNOSIS — Z125 Encounter for screening for malignant neoplasm of prostate: Secondary | ICD-10-CM

## 2024-06-25 DIAGNOSIS — E785 Hyperlipidemia, unspecified: Secondary | ICD-10-CM | POA: Diagnosis not present

## 2024-06-25 DIAGNOSIS — R5383 Other fatigue: Secondary | ICD-10-CM | POA: Diagnosis not present

## 2024-06-25 DIAGNOSIS — I10 Essential (primary) hypertension: Secondary | ICD-10-CM

## 2024-06-25 DIAGNOSIS — G478 Other sleep disorders: Secondary | ICD-10-CM

## 2024-06-25 DIAGNOSIS — Z0000 Encounter for general adult medical examination without abnormal findings: Secondary | ICD-10-CM | POA: Diagnosis not present

## 2024-06-25 DIAGNOSIS — R7303 Prediabetes: Secondary | ICD-10-CM | POA: Diagnosis not present

## 2024-06-25 LAB — COMPREHENSIVE METABOLIC PANEL WITH GFR
ALT: 35 U/L (ref 0–53)
AST: 30 U/L (ref 0–37)
Albumin: 4.7 g/dL (ref 3.5–5.2)
Alkaline Phosphatase: 74 U/L (ref 39–117)
BUN: 18 mg/dL (ref 6–23)
CO2: 27 meq/L (ref 19–32)
Calcium: 9.5 mg/dL (ref 8.4–10.5)
Chloride: 101 meq/L (ref 96–112)
Creatinine, Ser: 1.22 mg/dL (ref 0.40–1.50)
GFR: 71.36 mL/min (ref >60.00)
Glucose, Bld: 79 mg/dL (ref 70–99)
Potassium: 4.7 meq/L (ref 3.5–5.1)
Sodium: 136 meq/L (ref 135–145)
Total Bilirubin: 0.6 mg/dL (ref 0.2–1.2)
Total Protein: 7.6 g/dL (ref 6.0–8.3)

## 2024-06-25 LAB — LIPID PANEL
Cholesterol: 267 mg/dL — ABNORMAL HIGH (ref 0–200)
HDL: 52.3 mg/dL (ref >39.00)
LDL Cholesterol: 197 mg/dL — ABNORMAL HIGH (ref 0–99)
NonHDL: 215.13
Total CHOL/HDL Ratio: 5
Triglycerides: 90 mg/dL (ref 0.0–149.0)
VLDL: 18 mg/dL (ref 0.0–40.0)

## 2024-06-25 LAB — CBC
HCT: 47.1 % (ref 39.0–52.0)
Hemoglobin: 15.6 g/dL (ref 13.0–17.0)
MCHC: 33.2 g/dL (ref 30.0–36.0)
MCV: 86.2 fl (ref 78.0–100.0)
Platelets: 316 K/uL (ref 150.0–400.0)
RBC: 5.46 Mil/uL (ref 4.22–5.81)
RDW: 14 % (ref 11.5–15.5)
WBC: 4 K/uL (ref 4.0–10.5)

## 2024-06-25 LAB — TSH: TSH: 1.14 u[IU]/mL (ref 0.35–5.50)

## 2024-06-25 LAB — PSA: PSA: 1.27 ng/mL (ref 0.10–4.00)

## 2024-06-25 LAB — VITAMIN D 25 HYDROXY (VIT D DEFICIENCY, FRACTURES): VITD: 30.04 ng/mL (ref 30.00–100.00)

## 2024-06-25 LAB — HEMOGLOBIN A1C: Hgb A1c MFr Bld: 6 % (ref 4.6–6.5)

## 2024-06-25 LAB — VITAMIN B12: Vitamin B-12: 776 pg/mL (ref 211–911)

## 2024-06-25 MED ORDER — LISINOPRIL 20 MG PO TABS: ORAL_TABLET | ORAL | 2 refills | Status: AC

## 2024-06-25 NOTE — Patient Instructions (Signed)
 I will check some other labs for fatigue, no change in blood pressure medicine for now.  Thank you for coming in today. If there are any concerns on your bloodwork, I will let you know. Take care!

## 2024-06-25 NOTE — Progress Notes (Signed)
 Subjective:  Patient ID: Jerry Johnston, male    DOB: 1976-01-29  Age: 48 y.o. MRN: [id number]  CC:  Chief Complaint  Patient presents with   Annual Exam    Pt is doing well, no concerns, pt is fasting, discuss HPV on HM item    HPI Jerry Johnston presents for Annual Exam, no acute concerns.   PCP, me Sleep specialist, Dr.Athar.  Recent home sleep test did not show any significant sleep apnea.  Intermittent snoring. Disrupted sleep that night with child at home.  No significant sleep apnea.  Option of dental device for disturbing snoring.  Option of further workup for tiredness and fatigue with checking for anemia, thyroid dysfunction, vitamin D  and B12 deficiencies.  Still some intermittent fatigue, depending on night before - intermittent sleep with child at home or nocturia on occasion - depending on water intake - 0-1 per night.   Hypertension: Lisinopril  20 mg daily. No new side effects.  Home readings: 120-130/70-80 BP Readings from Last 3 Encounters:  06/25/24 116/68  04/14/24 (!) 141/86  12/19/23 118/70   Lab Results  Component Value Date   CREATININE 1.25 12/19/2023   Hyperlipidemia: Diet/exercise approach.  10-year ASCVD risk for previously 6.5%.  Weight has improved. Lab Results  Component Value Date   CHOL 247 (H) 12/19/2023   HDL 52.70 12/19/2023   LDLCALC 176 (H) 12/19/2023   TRIG 91.0 12/19/2023   CHOLHDL 5 12/19/2023   Lab Results  Component Value Date   ALT 33 12/19/2023   AST 32 12/19/2023   ALKPHOS 87 12/19/2023   BILITOT 0.6 12/19/2023   Prediabetes: As above, diet/exercise approach with weight improvement. Lab Results  Component Value Date   HGBA1C 6.0 12/19/2023   Wt Readings from Last 3 Encounters:  06/25/24 201 lb 12.8 oz (91.5 kg)  04/14/24 206 lb 12.8 oz (93.8 kg)  12/19/23 209 lb 3.2 oz (94.9 kg)          06/25/2024    8:02 AM 12/19/2023    1:10 PM 05/27/2023    9:11 AM 05/15/2023   10:34 AM 08/08/2022    8:31 AM   Depression screen PHQ 2/9  Decreased Interest 0 0 0 0 0  Down, Depressed, Hopeless 0 0 0 0 0  PHQ - 2 Score 0 0 0 0 0  Altered sleeping 1 1   0  Tired, decreased energy 1 0   0  Change in appetite 0 0   0  Feeling bad or failure about yourself  0 0   0  Trouble concentrating 1 0   2  Moving slowly or fidgety/restless 0 0   0  Suicidal thoughts 0 0   0  PHQ-9 Score 3 1   2   Difficult doing work/chores Not difficult at all        Health Maintenance  Topic Date Due   HPV VACCINES (1 - 3-dose SCDM series) Never done   COVID-19 Vaccine (4 - 2024-25 season) 08/18/2023   INFLUENZA VACCINE  07/17/2024   DTaP/Tdap/Td (3 - Td or Tdap) 06/07/2027   Colonoscopy  07/14/2033   Hepatitis B Vaccines  Completed   Hepatitis C Screening  Completed   HIV Screening  Completed   Meningococcal B Vaccine  Aged Out  Family history of cancer with father having gastric, pancreatic cancer. Colon cancer screening underwent colonoscopy in July 2024.  Non-precancerous polyp removed, repeat in 10 years. Prostate: does not have family history of prostate cancer, elevated  PSA previously, then improved on recheck last year. The natural history of prostate cancer and ongoing controversy regarding screening and potential treatment outcomes of prostate cancer has been discussed with the patient. The meaning of a false positive PSA and a false negative PSA has been discussed. He indicates understanding of the limitations of this screening test and wishes to proceed with screening PSA testing. Lab Results  Component Value Date   PSA1 1.8 01/04/2021   PSA1 1.2 01/01/2020   PSA1 1.4 12/29/2018   PSA 1.61 05/27/2023   PSA 4.05 (H) 05/15/2023   PSA 1.19 02/08/2022   Declines sti screening.    Immunization History  Administered Date(s) Administered   Hepatitis A 03/28/2007, 09/30/2007   Hepatitis B 03/28/2007, 04/28/2007, 09/30/2007, 05/31/2014   Hepatitis B, ADULT 05/31/2014   Influenza Inj Mdck Quad Pf  09/19/2017, 10/11/2022   Influenza Inj Mdck Quad With Preservative 12/20/2018   Influenza Split 09/30/2013, 09/30/2014   Influenza, Seasonal, Injecte, Preservative Fre 12/19/2023   Influenza,inj,Quad PF,6+ Mos 12/05/2015, 12/06/2016, 10/05/2019   PFIZER(Purple Top)SARS-COV-2 Vaccination 03/09/2020, 03/30/2020, 05/05/2021   Tdap 03/28/2007, 06/06/2017  Covid and flu vaccine in the fall recommended.  Defers HPV vaccine- will be 48yo in few days.   No results found. Appt with optho 1 month ago - no concerns or acute changes.   Dental: due, appt last December. Ongoing care.  Alcohol: rare. 1-2/month or less.   Tobacco: none.   Exercise: under desk treadmill, pushups b/t meetings.    History Patient Active Problem List   Diagnosis Date Noted   Essential hypertension 12/19/2017   Drug allergy 03/31/2013   Past Medical History:  Diagnosis Date   Hypertension    Past Surgical History:  Procedure Laterality Date   WRIST SURGERY     Allergies  Allergen Reactions   Percocet [Oxycodone-Acetaminophen]     ? Reaction pt stated he had broken out during the time of taking med.   Prior to Admission medications   Medication Sig Start Date End Date Taking? Authorizing Provider  lisinopril  (ZESTRIL ) 20 MG tablet TAKE 1 TABLET BY MOUTH EVERY DAY. 12/19/23  Yes Levora Reyes SAUNDERS, MD  hydrOXYzine  (ATARAX ) 25 MG tablet Take 0.5-1 tablets (12.5-25 mg total) by mouth at bedtime as needed. Patient not taking: Reported on 06/25/2024 08/08/22   Levora Reyes SAUNDERS, MD   Social History   Socioeconomic History   Marital status: Married    Spouse name: Not on file   Number of children: 1   Years of education: Not on file   Highest education level: Bachelor's degree (e.g., BA, AB, BS)  Occupational History   Occupation: Psychiatric nurse  Tobacco Use   Smoking status: Never   Smokeless tobacco: Never  Substance and Sexual Activity   Alcohol use: Not Currently    Comment: less then one a month   Drug  use: No   Sexual activity: Yes    Birth control/protection: Condom  Other Topics Concern   Not on file  Social History Narrative   Single. Education: [employer].   Exercise: Yes   Social Drivers of Health   Financial Resource Strain: Low Risk  (06/24/2024)   Overall Financial Resource Strain (CARDIA)    Difficulty of Paying Living Expenses: Not hard at all  Food Insecurity: No Food Insecurity (06/24/2024)   Hunger Vital Sign    Worried About Running Out of Food in the Last Year: Never true    Ran Out of Food in the Last Year: Never true  Transportation Needs: No Transportation Needs (06/24/2024)   PRAPARE - Administrator, Civil Service (Medical): No    Lack of Transportation (Non-Medical): No  Physical Activity: Insufficiently Active (06/24/2024)   Exercise Vital Sign    Days of Exercise per Week: 3 days    Minutes of Exercise per Session: 20 min  Stress: No Stress Concern Present (06/24/2024)   [employer] of Occupational Health - Occupational Stress Questionnaire    Feeling of Stress: Not at all  Social Connections: Moderately Isolated (06/24/2024)   Social Connection and Isolation Panel    Frequency of Communication with Friends and Family: Three times a week    Frequency of Social Gatherings with Friends and Family: Once a week    Attends Religious Services: Patient declined    Database administrator or Organizations: No    Attends Engineer, structural: Not on file    Marital Status: Married  Catering manager Violence: Not on file    Review of Systems 13 point review of systems per patient health survey noted.  Negative other than as indicated above or in HPI.    Objective:   Vitals:   06/25/24 0800  BP: 116/68  Pulse: 77  Resp: 12  Temp: 97.8 F (36.6 C)  TempSrc: Temporal  SpO2: 98%  Weight: 201 lb 12.8 oz (91.5 kg)  Height: 6' 0.25 (1.835 m)     Physical Exam Vitals reviewed.  Constitutional:      Appearance: He is well-developed.   HENT:     Head: Normocephalic and atraumatic.     Right Ear: External ear normal.     Left Ear: External ear normal.  Eyes:     Conjunctiva/sclera: Conjunctivae normal.     Pupils: Pupils are equal, round, and reactive to light.  Neck:     Thyroid: No thyromegaly.  Cardiovascular:     Rate and Rhythm: Normal rate and regular rhythm.     Heart sounds: Normal heart sounds.  Pulmonary:     Effort: Pulmonary effort is normal. No respiratory distress.     Breath sounds: Normal breath sounds. No wheezing.  Abdominal:     General: There is no distension.     Palpations: Abdomen is soft.     Tenderness: There is no abdominal tenderness.  Musculoskeletal:        General: No tenderness. Normal range of motion.     Cervical back: Normal range of motion and neck supple.  Lymphadenopathy:     Cervical: No cervical adenopathy.  Skin:    General: Skin is warm and dry.  Neurological:     Mental Status: He is alert and oriented to person, place, and time.     Deep Tendon Reflexes: Reflexes are normal and symmetric.  Psychiatric:        Behavior: Behavior normal.        Assessment & Plan:  Jerry Johnston is a 48 y.o. male . Annual physical exam  - -anticipatory guidance as below in AVS, screening labs above. Health maintenance items as above in HPI discussed/recommended as applicable.   Essential hypertension - Plan: Comprehensive metabolic panel with GFR, TSH, CBC -  Stable, tolerating current regimen. Medications refilled. Labs pending as above.   Hyperlipidemia, unspecified hyperlipidemia type - Plan: Comprehensive metabolic panel with GFR, Lipid panel  - Check labs, low ASCVD risk for previously.  Commended on weight improvement.  Continue diet/exercise approach.  Wakes up during night  - Recent sleep study  noted without significant sleep apnea.  Option dental device.  Interrupted sleep with young child at home at times, rare nocturia.  No consistent symptoms.   Fatigue,  unspecified type - Plan: TSH, CBC, Vitamin D  (25 hydroxy), B12  - Interrupted sleep likely responsible but check labs as above for deficiencies.  Prediabetes - Plan: Hemoglobin A1c  - Weight has improved, anticipate improved A1c.  Commended on his efforts.  Check labs and adjust plan accordingly.  Screening for prostate cancer - Plan: PSA   No orders of the defined types were placed in this encounter.  Patient Instructions  I will check some other labs for fatigue, no change in blood pressure medicine for now.  Thank you for coming in today. If there are any concerns on your bloodwork, I will let you know. Take care!       Signed,   Reyes Pines, MD [facility], [facility] 06/25/24 8:29 AM

## 2024-06-28 ENCOUNTER — Ambulatory Visit: Payer: Self-pay | Admitting: Family Medicine

## 2024-06-28 DIAGNOSIS — E785 Hyperlipidemia, unspecified: Secondary | ICD-10-CM

## 2024-07-06 NOTE — Telephone Encounter (Signed)
 Patient is willing to try low dose statin.

## 2024-07-07 MED ORDER — ATORVASTATIN CALCIUM 10 MG PO TABS: 10.0000 mg | ORAL_TABLET | Freq: Every day | ORAL | 0 refills | Status: AC

## 2024-12-25 ENCOUNTER — Ambulatory Visit: Admitting: Family Medicine

## 2025-02-26 ENCOUNTER — Ambulatory Visit: Admitting: Family Medicine
